# Patient Record
Sex: Female | Born: 1935 | Race: White | Hispanic: No | Smoking: Never smoker
Health system: Southern US, Community
[De-identification: ages and names within clinical notes are randomized; demographics above are authoritative.]

## PROBLEM LIST (undated history)

## (undated) DIAGNOSIS — I8390 Asymptomatic varicose veins of unspecified lower extremity: Secondary | ICD-10-CM

## (undated) DIAGNOSIS — G629 Polyneuropathy, unspecified: Secondary | ICD-10-CM

## (undated) DIAGNOSIS — R42 Dizziness and giddiness: Secondary | ICD-10-CM

## (undated) DIAGNOSIS — K219 Gastro-esophageal reflux disease without esophagitis: Secondary | ICD-10-CM

## (undated) DIAGNOSIS — K279 Peptic ulcer, site unspecified, unspecified as acute or chronic, without hemorrhage or perforation: Secondary | ICD-10-CM

## (undated) DIAGNOSIS — E611 Iron deficiency: Secondary | ICD-10-CM

## (undated) HISTORY — PX: TONSILLECTOMY: SUR1361

## (undated) HISTORY — DX: Iron deficiency: E61.1

## (undated) HISTORY — DX: Dizziness and giddiness: R42

## (undated) HISTORY — DX: Gastro-esophageal reflux disease without esophagitis: K21.9

## (undated) HISTORY — DX: Peptic ulcer, site unspecified, unspecified as acute or chronic, without hemorrhage or perforation: K27.9

## (undated) HISTORY — DX: Asymptomatic varicose veins of unspecified lower extremity: I83.90

## (undated) HISTORY — PX: ABDOMINAL HYSTERECTOMY: SHX81

## (undated) HISTORY — DX: Polyneuropathy, unspecified: G62.9

## (undated) HISTORY — PX: CATARACT EXTRACTION: SUR2

## (undated) HISTORY — PX: APPENDECTOMY: SHX54

## (undated) HISTORY — PX: BLADDER REPAIR: SHX76

## (undated) HISTORY — PX: FRACTURE SURGERY: SHX138

---

## 1993-06-13 ENCOUNTER — Encounter: Payer: Self-pay | Admitting: Family Medicine

## 2000-06-13 HISTORY — PX: OTHER SURGICAL HISTORY: SHX169

## 2000-07-04 ENCOUNTER — Encounter: Payer: Self-pay | Admitting: Family Medicine

## 2000-07-04 ENCOUNTER — Encounter: Admission: RE | Admit: 2000-07-04 | Discharge: 2000-07-04 | Payer: Self-pay | Admitting: Family Medicine

## 2000-07-13 HISTORY — PX: SPINE SURGERY: SHX786

## 2001-08-13 HISTORY — PX: OTHER SURGICAL HISTORY: SHX169

## 2003-01-14 HISTORY — PX: OTHER SURGICAL HISTORY: SHX169

## 2004-01-17 ENCOUNTER — Ambulatory Visit: Payer: Self-pay | Admitting: Family Medicine

## 2004-01-30 ENCOUNTER — Ambulatory Visit: Payer: Self-pay | Admitting: Family Medicine

## 2004-02-13 HISTORY — PX: COLONOSCOPY: SHX174

## 2004-02-24 ENCOUNTER — Ambulatory Visit: Payer: Self-pay | Admitting: Unknown Physician Specialty

## 2004-10-20 ENCOUNTER — Ambulatory Visit: Payer: Self-pay | Admitting: Family Medicine

## 2004-11-04 ENCOUNTER — Encounter: Payer: Self-pay | Admitting: Family Medicine

## 2004-11-13 ENCOUNTER — Encounter: Payer: Self-pay | Admitting: Family Medicine

## 2005-01-08 ENCOUNTER — Ambulatory Visit: Payer: Self-pay | Admitting: Family Medicine

## 2005-01-13 HISTORY — PX: OTHER SURGICAL HISTORY: SHX169

## 2005-01-13 LAB — HM DEXA SCAN

## 2005-03-15 HISTORY — PX: FOOT SURGERY: SHX648

## 2005-08-17 ENCOUNTER — Ambulatory Visit: Payer: Self-pay | Admitting: Podiatry

## 2005-08-17 ENCOUNTER — Other Ambulatory Visit: Payer: Self-pay

## 2005-08-20 ENCOUNTER — Ambulatory Visit: Payer: Self-pay | Admitting: Podiatry

## 2005-09-21 ENCOUNTER — Ambulatory Visit: Payer: Self-pay | Admitting: Family Medicine

## 2005-10-07 ENCOUNTER — Ambulatory Visit: Payer: Self-pay | Admitting: Family Medicine

## 2005-10-08 ENCOUNTER — Ambulatory Visit: Payer: Self-pay | Admitting: Oncology

## 2005-10-13 HISTORY — PX: FETAL BLOOD TRANSFUSION: SHX1602

## 2005-10-25 LAB — CBC WITH DIFFERENTIAL (CANCER CENTER ONLY)
BASO#: 0 10*3/uL (ref 0.0–0.2)
HCT: 32.4 % — ABNORMAL LOW (ref 34.8–46.6)
HGB: 10.5 g/dL — ABNORMAL LOW (ref 11.6–15.9)
MCH: 26.3 pg (ref 26.0–34.0)
MCHC: 32.5 g/dL (ref 32.0–36.0)
RDW: 16.8 % — ABNORMAL HIGH (ref 10.5–14.6)

## 2005-10-25 LAB — MORPHOLOGY - CHCC SATELLITE: PLT EST ~~LOC~~: ADEQUATE

## 2005-10-26 LAB — COMPREHENSIVE METABOLIC PANEL
AST: 24 U/L (ref 0–37)
Albumin: 3.4 g/dL — ABNORMAL LOW (ref 3.5–5.2)
Alkaline Phosphatase: 61 U/L (ref 39–117)
BUN: 25 mg/dL — ABNORMAL HIGH (ref 6–23)
Creatinine, Ser: 1.1 mg/dL (ref 0.40–1.20)
Glucose, Bld: 127 mg/dL — ABNORMAL HIGH (ref 70–99)
Potassium: 4.8 mEq/L (ref 3.5–5.3)
Total Bilirubin: 0.5 mg/dL (ref 0.3–1.2)

## 2005-10-26 LAB — VITAMIN B12: Vitamin B-12: 671 pg/mL (ref 211–911)

## 2005-10-26 LAB — IRON AND TIBC
%SAT: 49 % (ref 20–55)
Iron: 212 ug/dL — ABNORMAL HIGH (ref 42–145)
TIBC: 432 ug/dL (ref 250–470)
UIBC: 220 ug/dL

## 2005-10-26 LAB — FOLATE: Folate: >20 ng/mL

## 2005-10-26 LAB — RETICULOCYTES (CHCC)
RBC.: 4 MIL/uL (ref 3.87–5.11)
Retic Ct Pct: 1.6 % (ref 0.4–3.1)

## 2005-10-26 LAB — PROTEIN ELECTROPHORESIS, SERUM
Alpha-1-Globulin: 4.9 % (ref 2.9–4.9)
Alpha-2-Globulin: 14.2 % — ABNORMAL HIGH (ref 7.1–11.8)
Beta Globulin: 7.3 % — ABNORMAL HIGH (ref 4.7–7.2)
Gamma Globulin: 12.9 % (ref 11.1–18.8)
Total Protein, Serum Electrophoresis: 6.6 g/dL (ref 6.0–8.3)

## 2005-10-26 LAB — ERYTHROPOIETIN: Erythropoietin: 25.5 m[IU]/mL (ref 2.6–34.0)

## 2005-10-27 ENCOUNTER — Encounter (HOSPITAL_COMMUNITY): Admission: RE | Admit: 2005-10-27 | Discharge: 2005-12-08 | Payer: Self-pay | Admitting: Oncology

## 2005-10-28 LAB — FERRITIN: Ferritin: 62 ng/mL (ref 10–291)

## 2005-11-01 LAB — TYPE & CROSSMATCH - CHCC SATELLITE

## 2005-11-11 ENCOUNTER — Ambulatory Visit: Payer: Self-pay | Admitting: Unknown Physician Specialty

## 2005-11-13 HISTORY — PX: ESOPHAGOGASTRODUODENOSCOPY: SHX1529

## 2005-11-16 LAB — CBC WITH DIFFERENTIAL (CANCER CENTER ONLY)
Eosinophils Absolute: 0.2 10*3/uL (ref 0.0–0.5)
HCT: 39.9 % (ref 34.8–46.6)
LYMPH#: 2.1 10*3/uL (ref 0.9–3.3)
LYMPH%: 27 % (ref 14.0–48.0)
MCV: 85 fL (ref 81–101)
MONO#: 0.6 10*3/uL (ref 0.1–0.9)
NEUT%: 61.6 % (ref 39.6–80.0)
RBC: 4.71 10*6/uL (ref 3.70–5.32)
WBC: 7.6 10*3/uL (ref 3.9–10.0)

## 2005-11-29 ENCOUNTER — Ambulatory Visit: Payer: Self-pay | Admitting: Oncology

## 2005-12-01 ENCOUNTER — Other Ambulatory Visit: Admission: RE | Admit: 2005-12-01 | Discharge: 2005-12-01 | Payer: Self-pay | Admitting: Oncology

## 2005-12-01 ENCOUNTER — Encounter (INDEPENDENT_AMBULATORY_CARE_PROVIDER_SITE_OTHER): Payer: Self-pay | Admitting: Specialist

## 2005-12-01 ENCOUNTER — Encounter: Payer: Self-pay | Admitting: Oncology

## 2005-12-01 LAB — CBC WITH DIFFERENTIAL (CANCER CENTER ONLY)
BASO#: 0 10*3/uL (ref 0.0–0.2)
BASO%: 0.3 % (ref 0.0–2.0)
HCT: 35.8 % (ref 34.8–46.6)
HGB: 11.8 g/dL (ref 11.6–15.9)
LYMPH%: 23 % (ref 14.0–48.0)
MCHC: 33 g/dL (ref 32.0–36.0)
MCV: 85 fL (ref 81–101)
MONO#: 0.5 10*3/uL (ref 0.1–0.9)
NEUT%: 66.1 % (ref 39.6–80.0)
RDW: 19.5 % — ABNORMAL HIGH (ref 10.5–14.6)
WBC: 6.6 10*3/uL (ref 3.9–10.0)

## 2005-12-28 ENCOUNTER — Ambulatory Visit: Payer: Self-pay | Admitting: Family Medicine

## 2006-01-19 ENCOUNTER — Ambulatory Visit: Payer: Self-pay | Admitting: Oncology

## 2006-01-21 LAB — CBC WITH DIFFERENTIAL (CANCER CENTER ONLY)
BASO#: 0 10*3/uL (ref 0.0–0.2)
EOS%: 2.2 % (ref 0.0–7.0)
HCT: 41 % (ref 34.8–46.6)
HGB: 13.5 g/dL (ref 11.6–15.9)
LYMPH#: 1.5 10*3/uL (ref 0.9–3.3)
MCHC: 32.8 g/dL (ref 32.0–36.0)
MCV: 91 fL (ref 81–101)
NEUT%: 65.4 % (ref 39.6–80.0)

## 2006-02-10 LAB — CBC WITH DIFFERENTIAL (CANCER CENTER ONLY)
BASO#: 0 10*3/uL (ref 0.0–0.2)
EOS%: 1.8 % (ref 0.0–7.0)
HCT: 38.4 % (ref 34.8–46.6)
HGB: 12.6 g/dL (ref 11.6–15.9)
MCH: 29.9 pg (ref 26.0–34.0)
MCHC: 32.9 g/dL (ref 32.0–36.0)
MONO%: 9.6 % (ref 0.0–13.0)
NEUT#: 3.7 10*3/uL (ref 1.5–6.5)
NEUT%: 62.3 % (ref 39.6–80.0)

## 2006-02-12 HISTORY — PX: ESOPHAGOGASTRODUODENOSCOPY: SHX1529

## 2006-02-26 ENCOUNTER — Ambulatory Visit: Payer: Self-pay | Admitting: Unknown Physician Specialty

## 2006-03-03 LAB — CBC WITH DIFFERENTIAL (CANCER CENTER ONLY)
BASO#: 0 10*3/uL (ref 0.0–0.2)
BASO%: 0.6 % (ref 0.0–2.0)
Eosinophils Absolute: 0.2 10*3/uL (ref 0.0–0.5)
HCT: 38.7 % (ref 34.8–46.6)
HGB: 12.9 g/dL (ref 11.6–15.9)
LYMPH#: 1.7 10*3/uL (ref 0.9–3.3)
MONO#: 0.6 10*3/uL (ref 0.1–0.9)
NEUT%: 61.7 % (ref 39.6–80.0)
RBC: 4.18 10*6/uL (ref 3.70–5.32)
WBC: 6.6 10*3/uL (ref 3.9–10.0)

## 2006-03-22 ENCOUNTER — Ambulatory Visit: Payer: Self-pay | Admitting: Oncology

## 2006-03-24 LAB — CBC WITH DIFFERENTIAL (CANCER CENTER ONLY)
BASO#: 0 10*3/uL (ref 0.0–0.2)
EOS%: 2.1 % (ref 0.0–7.0)
Eosinophils Absolute: 0.1 10*3/uL (ref 0.0–0.5)
HGB: 13.4 g/dL (ref 11.6–15.9)
LYMPH#: 1.7 10*3/uL (ref 0.9–3.3)
MCH: 30.8 pg (ref 26.0–34.0)
MONO%: 9 % (ref 0.0–13.0)
NEUT#: 3.5 10*3/uL (ref 1.5–6.5)
Platelets: 217 10*3/uL (ref 145–400)
RBC: 4.35 10*6/uL (ref 3.70–5.32)

## 2006-04-14 LAB — CBC WITH DIFFERENTIAL (CANCER CENTER ONLY)
BASO#: 0 10*3/uL (ref 0.0–0.2)
BASO%: 0.4 % (ref 0.0–2.0)
EOS%: 2.3 % (ref 0.0–7.0)
HGB: 12.4 g/dL (ref 11.6–15.9)
LYMPH#: 1.9 10*3/uL (ref 0.9–3.3)
MCH: 31.1 pg (ref 26.0–34.0)
MCHC: 33.7 g/dL (ref 32.0–36.0)
MONO%: 9.9 % (ref 0.0–13.0)
NEUT#: 3.6 10*3/uL (ref 1.5–6.5)
Platelets: 231 10*3/uL (ref 145–400)
RDW: 13.7 % (ref 10.5–14.6)

## 2006-05-05 LAB — CBC WITH DIFFERENTIAL (CANCER CENTER ONLY)
BASO#: 0 10*3/uL (ref 0.0–0.2)
BASO%: 0.3 % (ref 0.0–2.0)
EOS%: 1.8 % (ref 0.0–7.0)
HCT: 36.5 % (ref 34.8–46.6)
HGB: 12.2 g/dL (ref 11.6–15.9)
LYMPH#: 1.5 10*3/uL (ref 0.9–3.3)
LYMPH%: 27.8 % (ref 14.0–48.0)
MCH: 31.1 pg (ref 26.0–34.0)
MCHC: 33.3 g/dL (ref 32.0–36.0)
MONO%: 10 % (ref 0.0–13.0)
NEUT%: 60.1 % (ref 39.6–80.0)
RDW: 13.7 % (ref 10.5–14.6)

## 2006-05-24 ENCOUNTER — Ambulatory Visit: Payer: Self-pay | Admitting: Oncology

## 2006-05-26 LAB — CBC WITH DIFFERENTIAL (CANCER CENTER ONLY)
BASO#: 0 10*3/uL (ref 0.0–0.2)
Eosinophils Absolute: 0.1 10*3/uL (ref 0.0–0.5)
HGB: 11.5 g/dL — ABNORMAL LOW (ref 11.6–15.9)
MCV: 95 fL (ref 81–101)
MONO#: 0.5 10*3/uL (ref 0.1–0.9)
NEUT#: 3.6 10*3/uL (ref 1.5–6.5)
Platelets: 270 10*3/uL (ref 145–400)
RBC: 3.77 10*6/uL (ref 3.70–5.32)
WBC: 5.5 10*3/uL (ref 3.9–10.0)

## 2006-05-26 LAB — IRON AND TIBC
TIBC: 334 ug/dL (ref 250–470)
UIBC: 275 ug/dL

## 2006-06-23 LAB — CBC WITH DIFFERENTIAL (CANCER CENTER ONLY)
BASO#: 0 10*3/uL (ref 0.0–0.2)
Eosinophils Absolute: 0.1 10*3/uL (ref 0.0–0.5)
HCT: 35.1 % (ref 34.8–46.6)
LYMPH%: 26.6 % (ref 14.0–48.0)
MCH: 30.1 pg (ref 26.0–34.0)
MCV: 92 fL (ref 81–101)
MONO#: 0.6 10*3/uL (ref 0.1–0.9)
NEUT%: 62.1 % (ref 39.6–80.0)
RBC: 3.84 10*6/uL (ref 3.70–5.32)
WBC: 6.4 10*3/uL (ref 3.9–10.0)

## 2006-07-04 ENCOUNTER — Ambulatory Visit: Payer: Self-pay | Admitting: Family Medicine

## 2006-07-20 ENCOUNTER — Ambulatory Visit: Payer: Self-pay | Admitting: Oncology

## 2006-07-21 LAB — CBC WITH DIFFERENTIAL (CANCER CENTER ONLY)
BASO%: 0.5 % (ref 0.0–2.0)
EOS%: 2.8 % (ref 0.0–7.0)
HCT: 33.3 % — ABNORMAL LOW (ref 34.8–46.6)
LYMPH%: 28.9 % (ref 14.0–48.0)
MCH: 28.9 pg (ref 26.0–34.0)
MCHC: 32.9 g/dL (ref 32.0–36.0)
MCV: 88 fL (ref 81–101)
MONO#: 0.6 10*3/uL (ref 0.1–0.9)
MONO%: 9.5 % (ref 0.0–13.0)
NEUT%: 58.3 % (ref 39.6–80.0)
RDW: 12 % (ref 10.5–14.6)

## 2006-07-27 ENCOUNTER — Telehealth (INDEPENDENT_AMBULATORY_CARE_PROVIDER_SITE_OTHER): Payer: Self-pay | Admitting: *Deleted

## 2006-08-24 LAB — CBC WITH DIFFERENTIAL (CANCER CENTER ONLY)
Eosinophils Absolute: 0.1 10*3/uL (ref 0.0–0.5)
MONO#: 0.5 10*3/uL (ref 0.1–0.9)
MONO%: 8.3 % (ref 0.0–13.0)
NEUT#: 4 10*3/uL (ref 1.5–6.5)
Platelets: 293 10*3/uL (ref 145–400)
RBC: 3.69 10*6/uL — ABNORMAL LOW (ref 3.70–5.32)
WBC: 6.4 10*3/uL (ref 3.9–10.0)

## 2006-09-14 ENCOUNTER — Ambulatory Visit: Payer: Self-pay | Admitting: Oncology

## 2006-09-15 LAB — BASIC METABOLIC PANEL
BUN: 27 mg/dL — ABNORMAL HIGH (ref 6–23)
Calcium: 8.8 mg/dL (ref 8.4–10.5)
Glucose, Bld: 91 mg/dL (ref 70–99)

## 2006-09-15 LAB — CBC WITH DIFFERENTIAL (CANCER CENTER ONLY)
Eosinophils Absolute: 0.1 10*3/uL (ref 0.0–0.5)
HCT: 34.3 % — ABNORMAL LOW (ref 34.8–46.6)
LYMPH%: 31.1 % (ref 14.0–48.0)
MCV: 81 fL (ref 81–101)
MONO#: 0.5 10*3/uL (ref 0.1–0.9)
Platelets: 298 10*3/uL (ref 145–400)
RBC: 4.25 10*6/uL (ref 3.70–5.32)
WBC: 6.2 10*3/uL (ref 3.9–10.0)

## 2006-10-06 LAB — CBC WITH DIFFERENTIAL (CANCER CENTER ONLY)
BASO#: 0 10*3/uL (ref 0.0–0.2)
BASO%: 0.5 % (ref 0.0–2.0)
Eosinophils Absolute: 0.1 10*3/uL (ref 0.0–0.5)
HCT: 32.1 % — ABNORMAL LOW (ref 34.8–46.6)
HGB: 10.4 g/dL — ABNORMAL LOW (ref 11.6–15.9)
LYMPH%: 31 % (ref 14.0–48.0)
MCV: 80 fL — ABNORMAL LOW (ref 81–101)
MONO#: 0.6 10*3/uL (ref 0.1–0.9)
NEUT%: 56.6 % (ref 39.6–80.0)
RDW: 15.9 % — ABNORMAL HIGH (ref 10.5–14.6)
WBC: 5.9 10*3/uL (ref 3.9–10.0)

## 2006-10-27 LAB — CBC WITH DIFFERENTIAL (CANCER CENTER ONLY)
BASO%: 0.6 % (ref 0.0–2.0)
EOS%: 2.2 % (ref 0.0–7.0)
HCT: 34.9 % (ref 34.8–46.6)
LYMPH#: 1.2 10*3/uL (ref 0.9–3.3)
LYMPH%: 22 % (ref 14.0–48.0)
MCHC: 32.1 g/dL (ref 32.0–36.0)
NEUT%: 60.3 % (ref 39.6–80.0)
Platelets: 247 10*3/uL (ref 145–400)
RDW: 16.5 % — ABNORMAL HIGH (ref 10.5–14.6)

## 2006-11-16 ENCOUNTER — Ambulatory Visit: Payer: Self-pay | Admitting: Oncology

## 2006-11-17 LAB — CBC WITH DIFFERENTIAL (CANCER CENTER ONLY)
BASO%: 0.3 % (ref 0.0–2.0)
EOS%: 1.8 % (ref 0.0–7.0)
HGB: 11.5 g/dL — ABNORMAL LOW (ref 11.6–15.9)
LYMPH#: 1.4 10*3/uL (ref 0.9–3.3)
MCHC: 32.5 g/dL (ref 32.0–36.0)
NEUT#: 4.3 10*3/uL (ref 1.5–6.5)
RDW: 17 % — ABNORMAL HIGH (ref 10.5–14.6)

## 2006-12-08 LAB — CBC WITH DIFFERENTIAL (CANCER CENTER ONLY)
EOS%: 2.1 % (ref 0.0–7.0)
MCH: 26 pg (ref 26.0–34.0)
MCHC: 31.9 g/dL — ABNORMAL LOW (ref 32.0–36.0)
MONO%: 7.3 % (ref 0.0–13.0)
NEUT#: 3.2 10*3/uL (ref 1.5–6.5)
Platelets: 248 10*3/uL (ref 145–400)
RBC: 4.77 10*6/uL (ref 3.70–5.32)

## 2006-12-29 LAB — CBC WITH DIFFERENTIAL (CANCER CENTER ONLY)
BASO%: 0.3 % (ref 0.0–2.0)
EOS%: 2.4 % (ref 0.0–7.0)
LYMPH#: 1.5 10*3/uL (ref 0.9–3.3)
MCH: 26.3 pg (ref 26.0–34.0)
MCHC: 32.2 g/dL (ref 32.0–36.0)
MONO%: 7.1 % (ref 0.0–13.0)
NEUT#: 4.8 10*3/uL (ref 1.5–6.5)
NEUT%: 68.3 % (ref 39.6–80.0)
RDW: 16.5 % — ABNORMAL HIGH (ref 10.5–14.6)

## 2007-01-06 ENCOUNTER — Ambulatory Visit: Payer: Self-pay | Admitting: Family Medicine

## 2007-01-18 ENCOUNTER — Ambulatory Visit: Payer: Self-pay | Admitting: Oncology

## 2007-01-19 LAB — CBC WITH DIFFERENTIAL (CANCER CENTER ONLY)
BASO#: 0 10*3/uL (ref 0.0–0.2)
EOS%: 2.7 % (ref 0.0–7.0)
HGB: 10.2 g/dL — ABNORMAL LOW (ref 11.6–15.9)
LYMPH#: 1.6 10*3/uL (ref 0.9–3.3)
MCH: 25.8 pg — ABNORMAL LOW (ref 26.0–34.0)
MCHC: 32.2 g/dL (ref 32.0–36.0)
MONO%: 8.5 % (ref 0.0–13.0)
NEUT#: 3.2 10*3/uL (ref 1.5–6.5)
Platelets: 268 10*3/uL (ref 145–400)
RBC: 3.95 10*6/uL (ref 3.70–5.32)

## 2007-02-10 LAB — CBC WITH DIFFERENTIAL (CANCER CENTER ONLY)
BASO#: 0 10*3/uL (ref 0.0–0.2)
Eosinophils Absolute: 0.2 10*3/uL (ref 0.0–0.5)
HGB: 10.3 g/dL — ABNORMAL LOW (ref 11.6–15.9)
LYMPH#: 1.8 10*3/uL (ref 0.9–3.3)
MCH: 25.7 pg — ABNORMAL LOW (ref 26.0–34.0)
MONO#: 0.6 10*3/uL (ref 0.1–0.9)
MONO%: 10.4 % (ref 0.0–13.0)
NEUT#: 3.5 10*3/uL (ref 1.5–6.5)
Platelets: 268 10*3/uL (ref 145–400)
RBC: 4.03 10*6/uL (ref 3.70–5.32)
WBC: 6.1 10*3/uL (ref 3.9–10.0)

## 2007-02-20 ENCOUNTER — Encounter: Payer: Self-pay | Admitting: Family Medicine

## 2007-03-03 LAB — CBC WITH DIFFERENTIAL (CANCER CENTER ONLY)
BASO#: 0.1 10*3/uL (ref 0.0–0.2)
Eosinophils Absolute: 0.2 10*3/uL (ref 0.0–0.5)
HCT: 33.4 % — ABNORMAL LOW (ref 34.8–46.6)
HGB: 10.7 g/dL — ABNORMAL LOW (ref 11.6–15.9)
LYMPH%: 23.8 % (ref 14.0–48.0)
MCH: 25.6 pg — ABNORMAL LOW (ref 26.0–34.0)
MCV: 80 fL — ABNORMAL LOW (ref 81–101)
MONO#: 0.9 10*3/uL (ref 0.1–0.9)
MONO%: 12.1 % (ref 0.0–13.0)
NEUT%: 61.4 % (ref 39.6–80.0)
Platelets: 256 10*3/uL (ref 145–400)
RBC: 4.16 10*6/uL (ref 3.70–5.32)
WBC: 7.3 10*3/uL (ref 3.9–10.0)

## 2007-03-03 LAB — BASIC METABOLIC PANEL
Calcium: 9.2 mg/dL (ref 8.4–10.5)
Sodium: 138 mEq/L (ref 135–145)

## 2007-03-03 LAB — RETICULOCYTES (CHCC)
ABS Retic: 46.9 10*3/uL (ref 19.0–186.0)
RBC.: 4.26 MIL/uL (ref 3.87–5.11)
Retic Ct Pct: 1.1 % (ref 0.4–3.1)

## 2007-03-03 LAB — FERRITIN: Ferritin: 19 ng/mL (ref 10–291)

## 2007-03-03 LAB — IRON AND TIBC: Iron: 46 ug/dL (ref 42–145)

## 2007-03-21 ENCOUNTER — Ambulatory Visit: Payer: Self-pay | Admitting: Oncology

## 2007-03-23 LAB — CBC WITH DIFFERENTIAL (CANCER CENTER ONLY)
BASO#: 0 10*3/uL (ref 0.0–0.2)
BASO%: 0.4 % (ref 0.0–2.0)
EOS%: 1.8 % (ref 0.0–7.0)
HCT: 33.1 % — ABNORMAL LOW (ref 34.8–46.6)
LYMPH%: 21.8 % (ref 14.0–48.0)
MCH: 27.5 pg (ref 26.0–34.0)
MCHC: 32.5 g/dL (ref 32.0–36.0)
MCV: 84 fL (ref 81–101)
MONO%: 8.8 % (ref 0.0–13.0)
NEUT%: 67.2 % (ref 39.6–80.0)
RDW: 16.5 % — ABNORMAL HIGH (ref 10.5–14.6)

## 2007-04-06 ENCOUNTER — Encounter (INDEPENDENT_AMBULATORY_CARE_PROVIDER_SITE_OTHER): Payer: Self-pay | Admitting: Internal Medicine

## 2007-04-06 ENCOUNTER — Encounter (INDEPENDENT_AMBULATORY_CARE_PROVIDER_SITE_OTHER): Payer: Self-pay | Admitting: *Deleted

## 2007-04-06 ENCOUNTER — Ambulatory Visit: Payer: Self-pay | Admitting: Family Medicine

## 2007-04-07 ENCOUNTER — Encounter (INDEPENDENT_AMBULATORY_CARE_PROVIDER_SITE_OTHER): Payer: Self-pay | Admitting: Internal Medicine

## 2007-04-07 ENCOUNTER — Telehealth (INDEPENDENT_AMBULATORY_CARE_PROVIDER_SITE_OTHER): Payer: Self-pay | Admitting: Internal Medicine

## 2007-04-07 DIAGNOSIS — S92909A Unspecified fracture of unspecified foot, initial encounter for closed fracture: Secondary | ICD-10-CM | POA: Insufficient documentation

## 2007-04-10 ENCOUNTER — Encounter (INDEPENDENT_AMBULATORY_CARE_PROVIDER_SITE_OTHER): Payer: Self-pay | Admitting: Internal Medicine

## 2007-05-04 LAB — CBC WITH DIFFERENTIAL (CANCER CENTER ONLY)
BASO%: 0.5 % (ref 0.0–2.0)
Eosinophils Absolute: 0.2 10*3/uL (ref 0.0–0.5)
LYMPH#: 2 10*3/uL (ref 0.9–3.3)
MONO#: 0.7 10*3/uL (ref 0.1–0.9)
Platelets: 214 10*3/uL (ref 145–400)
RBC: 3.99 10*6/uL (ref 3.70–5.32)
RDW: 17.9 % — ABNORMAL HIGH (ref 10.5–14.6)
WBC: 6.2 10*3/uL (ref 3.9–10.0)

## 2007-05-08 ENCOUNTER — Ambulatory Visit: Payer: Self-pay | Admitting: Family Medicine

## 2007-05-08 DIAGNOSIS — M810 Age-related osteoporosis without current pathological fracture: Secondary | ICD-10-CM | POA: Insufficient documentation

## 2007-05-08 DIAGNOSIS — Z85828 Personal history of other malignant neoplasm of skin: Secondary | ICD-10-CM

## 2007-05-08 DIAGNOSIS — I447 Left bundle-branch block, unspecified: Secondary | ICD-10-CM

## 2007-05-08 DIAGNOSIS — K279 Peptic ulcer, site unspecified, unspecified as acute or chronic, without hemorrhage or perforation: Secondary | ICD-10-CM | POA: Insufficient documentation

## 2007-05-08 DIAGNOSIS — A048 Other specified bacterial intestinal infections: Secondary | ICD-10-CM | POA: Insufficient documentation

## 2007-05-08 DIAGNOSIS — M5137 Other intervertebral disc degeneration, lumbosacral region: Secondary | ICD-10-CM

## 2007-05-08 DIAGNOSIS — M549 Dorsalgia, unspecified: Secondary | ICD-10-CM | POA: Insufficient documentation

## 2007-05-08 DIAGNOSIS — K219 Gastro-esophageal reflux disease without esophagitis: Secondary | ICD-10-CM | POA: Insufficient documentation

## 2007-05-08 DIAGNOSIS — D638 Anemia in other chronic diseases classified elsewhere: Secondary | ICD-10-CM | POA: Insufficient documentation

## 2007-05-09 DIAGNOSIS — R5381 Other malaise: Secondary | ICD-10-CM

## 2007-05-09 DIAGNOSIS — R5383 Other fatigue: Secondary | ICD-10-CM

## 2007-05-09 LAB — CONVERTED CEMR LAB
Alkaline Phosphatase: 53 units/L (ref 39–117)
BUN: 30 mg/dL — ABNORMAL HIGH (ref 6–23)
Basophils Absolute: 0 10*3/uL (ref 0.0–0.1)
Bilirubin, Direct: 0.1 mg/dL (ref 0.0–0.3)
Calcium: 9.2 mg/dL (ref 8.4–10.5)
Eosinophils Absolute: 0.2 10*3/uL (ref 0.0–0.6)
GFR calc Af Amer: 55 mL/min
Glucose, Bld: 124 mg/dL — ABNORMAL HIGH (ref 70–99)
HCT: 37.2 % (ref 36.0–46.0)
Hemoglobin: 11.7 g/dL — ABNORMAL LOW (ref 12.0–15.0)
Lymphocytes Relative: 18 % (ref 12.0–46.0)
MCHC: 31.4 g/dL (ref 30.0–36.0)
MCV: 89.4 fL (ref 78.0–100.0)
Monocytes Absolute: 1 10*3/uL — ABNORMAL HIGH (ref 0.2–0.7)
Neutro Abs: 6 10*3/uL (ref 1.4–7.7)
Neutrophils Relative %: 69.2 % (ref 43.0–77.0)
Potassium: 4.6 meq/L (ref 3.5–5.1)
TSH: 3.94 microintl units/mL (ref 0.35–5.50)
Total Bilirubin: 0.5 mg/dL (ref 0.3–1.2)
Total Protein: 6.8 g/dL (ref 6.0–8.3)

## 2007-05-17 ENCOUNTER — Ambulatory Visit: Payer: Self-pay | Admitting: Family Medicine

## 2007-05-17 DIAGNOSIS — F329 Major depressive disorder, single episode, unspecified: Secondary | ICD-10-CM

## 2007-05-18 ENCOUNTER — Ambulatory Visit: Payer: Self-pay | Admitting: Internal Medicine

## 2007-05-22 ENCOUNTER — Ambulatory Visit: Payer: Self-pay | Admitting: Oncology

## 2007-05-23 ENCOUNTER — Ambulatory Visit: Payer: Self-pay

## 2007-05-23 ENCOUNTER — Encounter: Payer: Self-pay | Admitting: Family Medicine

## 2007-05-25 LAB — CBC WITH DIFFERENTIAL (CANCER CENTER ONLY)
EOS%: 5.8 % (ref 0.0–7.0)
Eosinophils Absolute: 0.3 10*3/uL (ref 0.0–0.5)
LYMPH%: 22 % (ref 14.0–48.0)
MCH: 27.9 pg (ref 26.0–34.0)
MCHC: 32.1 g/dL (ref 32.0–36.0)
MCV: 87 fL (ref 81–101)
MONO%: 12.9 % (ref 0.0–13.0)
NEUT#: 3.3 10*3/uL (ref 1.5–6.5)
Platelets: 254 10*3/uL (ref 145–400)
RBC: 4.05 10*6/uL (ref 3.70–5.32)

## 2007-06-01 ENCOUNTER — Encounter: Payer: Self-pay | Admitting: Family Medicine

## 2007-06-01 ENCOUNTER — Ambulatory Visit: Payer: Self-pay | Admitting: Family Medicine

## 2007-06-05 ENCOUNTER — Encounter (INDEPENDENT_AMBULATORY_CARE_PROVIDER_SITE_OTHER): Payer: Self-pay | Admitting: *Deleted

## 2007-06-15 LAB — CBC WITH DIFFERENTIAL (CANCER CENTER ONLY)
BASO%: 0.5 % (ref 0.0–2.0)
EOS%: 2.6 % (ref 0.0–7.0)
LYMPH%: 24.2 % (ref 14.0–48.0)
MCH: 27.8 pg (ref 26.0–34.0)
MCHC: 32.2 g/dL (ref 32.0–36.0)
MCV: 86 fL (ref 81–101)
MONO%: 10.9 % (ref 0.0–13.0)
Platelets: 232 10*3/uL (ref 145–400)
RDW: 13.8 % (ref 10.5–14.6)
WBC: 4.5 10*3/uL (ref 3.9–10.0)

## 2007-06-27 ENCOUNTER — Ambulatory Visit: Payer: Self-pay | Admitting: Family Medicine

## 2007-07-05 ENCOUNTER — Ambulatory Visit: Payer: Self-pay | Admitting: Oncology

## 2007-07-06 LAB — CBC WITH DIFFERENTIAL (CANCER CENTER ONLY)
BASO%: 0.4 % (ref 0.0–2.0)
LYMPH%: 26.6 % (ref 14.0–48.0)
MCH: 27.3 pg (ref 26.0–34.0)
MCV: 84 fL (ref 81–101)
MONO#: 0.6 10*3/uL (ref 0.1–0.9)
MONO%: 9.6 % (ref 0.0–13.0)
NEUT#: 3.7 10*3/uL (ref 1.5–6.5)
Platelets: 247 10*3/uL (ref 145–400)
RBC: 4.18 10*6/uL (ref 3.70–5.32)
RDW: 14 % (ref 10.5–14.6)
WBC: 6.1 10*3/uL (ref 3.9–10.0)

## 2007-08-17 ENCOUNTER — Encounter: Payer: Self-pay | Admitting: Family Medicine

## 2007-08-17 LAB — BASIC METABOLIC PANEL
BUN: 21 mg/dL (ref 6–23)
Chloride: 102 mEq/L (ref 96–112)
Creatinine, Ser: 0.98 mg/dL (ref 0.40–1.20)

## 2007-08-17 LAB — CBC WITH DIFFERENTIAL (CANCER CENTER ONLY)
BASO#: 0 10*3/uL (ref 0.0–0.2)
BASO%: 0.5 % (ref 0.0–2.0)
EOS%: 2.2 % (ref 0.0–7.0)
HCT: 37.4 % (ref 34.8–46.6)
HGB: 12.5 g/dL (ref 11.6–15.9)
LYMPH#: 1.2 10*3/uL (ref 0.9–3.3)
MONO#: 0.6 10*3/uL (ref 0.1–0.9)
NEUT#: 3.1 10*3/uL (ref 1.5–6.5)
NEUT%: 62 % (ref 39.6–80.0)
RDW: 15.3 % — ABNORMAL HIGH (ref 10.5–14.6)
WBC: 5 10*3/uL (ref 3.9–10.0)

## 2007-08-18 LAB — IRON AND TIBC
Iron: 341 ug/dL — ABNORMAL HIGH (ref 42–145)
TIBC: 397 ug/dL (ref 250–470)
UIBC: 56 ug/dL

## 2007-09-06 ENCOUNTER — Ambulatory Visit: Payer: Self-pay | Admitting: Oncology

## 2007-09-07 LAB — CBC WITH DIFFERENTIAL (CANCER CENTER ONLY)
BASO#: 0 10*3/uL (ref 0.0–0.2)
BASO%: 0.2 % (ref 0.0–2.0)
EOS%: 3 % (ref 0.0–7.0)
Eosinophils Absolute: 0.2 10*3/uL (ref 0.0–0.5)
HCT: 34.2 % — ABNORMAL LOW (ref 34.8–46.6)
HGB: 11.4 g/dL — ABNORMAL LOW (ref 11.6–15.9)
LYMPH#: 1.4 10*3/uL (ref 0.9–3.3)
LYMPH%: 26.8 % (ref 14.0–48.0)
MCH: 28.5 pg (ref 26.0–34.0)
MCHC: 33.4 g/dL (ref 32.0–36.0)
MCV: 85 fL (ref 81–101)
MONO#: 0.5 10*3/uL (ref 0.1–0.9)
MONO%: 8.9 % (ref 0.0–13.0)
NEUT#: 3.2 10*3/uL (ref 1.5–6.5)
NEUT%: 61.1 % (ref 39.6–80.0)
Platelets: 226 10*3/uL (ref 145–400)
RBC: 4 10*6/uL (ref 3.70–5.32)
RDW: 15.2 % — ABNORMAL HIGH (ref 10.5–14.6)
WBC: 5.2 10*3/uL (ref 3.9–10.0)

## 2007-09-28 LAB — CBC WITH DIFFERENTIAL (CANCER CENTER ONLY)
BASO#: 0 10*3/uL (ref 0.0–0.2)
EOS%: 2.5 % (ref 0.0–7.0)
Eosinophils Absolute: 0.2 10*3/uL (ref 0.0–0.5)
HCT: 31 % — ABNORMAL LOW (ref 34.8–46.6)
HGB: 10.2 g/dL — ABNORMAL LOW (ref 11.6–15.9)
LYMPH#: 1.4 10*3/uL (ref 0.9–3.3)
MCHC: 32.8 g/dL (ref 32.0–36.0)
NEUT#: 3.9 10*3/uL (ref 1.5–6.5)
NEUT%: 64.6 % (ref 39.6–80.0)
RBC: 3.79 10*6/uL (ref 3.70–5.32)

## 2007-10-19 LAB — CBC WITH DIFFERENTIAL (CANCER CENTER ONLY)
BASO#: 0 10*3/uL (ref 0.0–0.2)
Eosinophils Absolute: 0.1 10*3/uL (ref 0.0–0.5)
HCT: 30.3 % — ABNORMAL LOW (ref 34.8–46.6)
HGB: 10 g/dL — ABNORMAL LOW (ref 11.6–15.9)
LYMPH%: 25.3 % (ref 14.0–48.0)
MCH: 26.3 pg (ref 26.0–34.0)
MCV: 80 fL — ABNORMAL LOW (ref 81–101)
MONO%: 10.3 % (ref 0.0–13.0)
RBC: 3.8 10*6/uL (ref 3.70–5.32)

## 2007-11-08 ENCOUNTER — Ambulatory Visit: Payer: Self-pay | Admitting: Oncology

## 2007-11-09 ENCOUNTER — Encounter: Payer: Self-pay | Admitting: Family Medicine

## 2007-11-09 LAB — CBC WITH DIFFERENTIAL (CANCER CENTER ONLY)
BASO#: 0 10*3/uL (ref 0.0–0.2)
EOS%: 2.1 % (ref 0.0–7.0)
Eosinophils Absolute: 0.1 10*3/uL (ref 0.0–0.5)
HCT: 27.8 % — ABNORMAL LOW (ref 34.8–46.6)
HGB: 8.9 g/dL — ABNORMAL LOW (ref 11.6–15.9)
LYMPH%: 20.2 % (ref 14.0–48.0)
MCH: 25 pg — ABNORMAL LOW (ref 26.0–34.0)
MCHC: 32 g/dL (ref 32.0–36.0)
MCV: 78 fL — ABNORMAL LOW (ref 81–101)
MONO%: 8.7 % (ref 0.0–13.0)
NEUT#: 4.4 10*3/uL (ref 1.5–6.5)
NEUT%: 68.8 % (ref 39.6–80.0)
RBC: 3.56 10*6/uL — ABNORMAL LOW (ref 3.70–5.32)

## 2007-11-10 ENCOUNTER — Encounter (HOSPITAL_COMMUNITY): Admission: RE | Admit: 2007-11-10 | Discharge: 2007-11-23 | Payer: Self-pay | Admitting: Oncology

## 2007-11-10 LAB — IRON AND TIBC
Iron: 404 ug/dL — ABNORMAL HIGH (ref 42–145)
UIBC: <55 ug/dL

## 2007-11-10 LAB — RETICULOCYTES (CHCC): Retic Ct Pct: 0.9 % (ref 0.4–3.1)

## 2007-11-10 LAB — FERRITIN: Ferritin: 29 ng/mL (ref 10–291)

## 2007-11-13 LAB — TYPE & CROSSMATCH - CHCC SATELLITE

## 2007-11-22 ENCOUNTER — Encounter: Payer: Self-pay | Admitting: Family Medicine

## 2007-11-22 LAB — CBC WITH DIFFERENTIAL (CANCER CENTER ONLY)
BASO#: 0 10*3/uL (ref 0.0–0.2)
Eosinophils Absolute: 0.1 10*3/uL (ref 0.0–0.5)
HCT: 39.3 % (ref 34.8–46.6)
HGB: 12.7 g/dL (ref 11.6–15.9)
LYMPH#: 1.2 10*3/uL (ref 0.9–3.3)
MCH: 26 pg (ref 26.0–34.0)
MONO%: 11.3 % (ref 0.0–13.0)
NEUT#: 3.4 10*3/uL (ref 1.5–6.5)
RBC: 4.88 10*6/uL (ref 3.70–5.32)

## 2007-11-27 LAB — FECAL OCCULT BLOOD, GUIAC - CHCC SATELLITE: Occult Blood: POSITIVE

## 2007-11-30 LAB — CBC WITH DIFFERENTIAL (CANCER CENTER ONLY)
BASO#: 0 10*3/uL (ref 0.0–0.2)
EOS%: 1.9 % (ref 0.0–7.0)
HCT: 36 % (ref 34.8–46.6)
LYMPH#: 1.3 10*3/uL (ref 0.9–3.3)
MCH: 25.9 pg — ABNORMAL LOW (ref 26.0–34.0)
MCV: 79 fL — ABNORMAL LOW (ref 81–101)
MONO#: 0.5 10*3/uL (ref 0.1–0.9)
NEUT#: 4 10*3/uL (ref 1.5–6.5)

## 2007-12-11 ENCOUNTER — Ambulatory Visit: Payer: Self-pay | Admitting: Family Medicine

## 2007-12-19 ENCOUNTER — Encounter: Payer: Self-pay | Admitting: Family Medicine

## 2007-12-19 LAB — CBC WITH DIFFERENTIAL (CANCER CENTER ONLY)
BASO#: 0 10*3/uL (ref 0.0–0.2)
Eosinophils Absolute: 0.2 10*3/uL (ref 0.0–0.5)
HGB: 10.6 g/dL — ABNORMAL LOW (ref 11.6–15.9)
LYMPH#: 1.3 10*3/uL (ref 0.9–3.3)
MCH: 25.5 pg — ABNORMAL LOW (ref 26.0–34.0)
MONO#: 0.7 10*3/uL (ref 0.1–0.9)
NEUT#: 4 10*3/uL (ref 1.5–6.5)
RBC: 4.17 10*6/uL (ref 3.70–5.32)
WBC: 6.2 10*3/uL (ref 3.9–10.0)

## 2008-01-02 ENCOUNTER — Encounter: Payer: Self-pay | Admitting: Family Medicine

## 2008-01-10 ENCOUNTER — Ambulatory Visit: Payer: Self-pay | Admitting: Oncology

## 2008-01-11 LAB — CBC WITH DIFFERENTIAL (CANCER CENTER ONLY)
BASO#: 0 10*3/uL (ref 0.0–0.2)
Eosinophils Absolute: 0.1 10*3/uL (ref 0.0–0.5)
HGB: 10.1 g/dL — ABNORMAL LOW (ref 11.6–15.9)
LYMPH%: 20.1 % (ref 14.0–48.0)
MCH: 25.2 pg — ABNORMAL LOW (ref 26.0–34.0)
MCV: 78 fL — ABNORMAL LOW (ref 81–101)
MONO%: 8.6 % (ref 0.0–13.0)
NEUT%: 68.8 % (ref 39.6–80.0)
RBC: 4.03 10*6/uL (ref 3.70–5.32)

## 2008-01-14 HISTORY — PX: ESOPHAGOGASTRODUODENOSCOPY: SHX1529

## 2008-01-23 ENCOUNTER — Ambulatory Visit: Payer: Self-pay | Admitting: Unknown Physician Specialty

## 2008-01-23 ENCOUNTER — Encounter: Payer: Self-pay | Admitting: Family Medicine

## 2008-02-01 LAB — CBC WITH DIFFERENTIAL (CANCER CENTER ONLY)
BASO#: 0 10*3/uL (ref 0.0–0.2)
BASO%: 0.6 % (ref 0.0–2.0)
EOS%: 2.5 % (ref 0.0–7.0)
HGB: 9.7 g/dL — ABNORMAL LOW (ref 11.6–15.9)
LYMPH#: 1.5 10*3/uL (ref 0.9–3.3)
MCHC: 32.2 g/dL (ref 32.0–36.0)
NEUT#: 3.2 10*3/uL (ref 1.5–6.5)
RBC: 3.88 10*6/uL (ref 3.70–5.32)

## 2008-02-22 ENCOUNTER — Encounter: Payer: Self-pay | Admitting: Family Medicine

## 2008-02-22 LAB — CBC WITH DIFFERENTIAL (CANCER CENTER ONLY)
BASO%: 0.6 % (ref 0.0–2.0)
EOS%: 2.2 % (ref 0.0–7.0)
HCT: 33.2 % — ABNORMAL LOW (ref 34.8–46.6)
LYMPH%: 24.4 % (ref 14.0–48.0)
MCHC: 31.4 g/dL — ABNORMAL LOW (ref 32.0–36.0)
MCV: 77 fL — ABNORMAL LOW (ref 81–101)
MONO%: 10.3 % (ref 0.0–13.0)
NEUT%: 62.5 % (ref 39.6–80.0)
RDW: 17.7 % — ABNORMAL HIGH (ref 10.5–14.6)

## 2008-02-23 LAB — IRON AND TIBC: UIBC: 367 ug/dL

## 2008-02-29 ENCOUNTER — Ambulatory Visit: Payer: Self-pay | Admitting: Oncology

## 2008-03-06 ENCOUNTER — Ambulatory Visit: Payer: Self-pay | Admitting: Family Medicine

## 2008-03-12 ENCOUNTER — Ambulatory Visit: Payer: Self-pay | Admitting: Family Medicine

## 2008-03-14 LAB — CBC WITH DIFFERENTIAL (CANCER CENTER ONLY)
BASO%: 0.5 % (ref 0.0–2.0)
EOS%: 1.7 % (ref 0.0–7.0)
LYMPH#: 1.5 10*3/uL (ref 0.9–3.3)
MCHC: 31.5 g/dL — ABNORMAL LOW (ref 32.0–36.0)
NEUT#: 3.1 10*3/uL (ref 1.5–6.5)
NEUT%: 59.2 % (ref 39.6–80.0)
RDW: 17.2 % — ABNORMAL HIGH (ref 10.5–14.6)

## 2008-04-03 ENCOUNTER — Encounter: Payer: Self-pay | Admitting: Family Medicine

## 2008-04-23 ENCOUNTER — Ambulatory Visit: Payer: Self-pay | Admitting: Oncology

## 2008-05-15 LAB — CBC WITH DIFFERENTIAL (CANCER CENTER ONLY)
BASO#: 0 10*3/uL (ref 0.0–0.2)
Eosinophils Absolute: 0.1 10*3/uL (ref 0.0–0.5)
HCT: 38.1 % (ref 34.8–46.6)
HGB: 12.3 g/dL (ref 11.6–15.9)
LYMPH#: 1.5 10*3/uL (ref 0.9–3.3)
LYMPH%: 32.6 % (ref 14.0–48.0)
MCH: 26.7 pg (ref 26.0–34.0)
MCHC: 32.4 g/dL (ref 32.0–36.0)
MCV: 83 fL (ref 81–101)
MONO#: 0.6 10*3/uL (ref 0.1–0.9)
MONO%: 11.6 % (ref 0.0–13.0)
Platelets: 209 10*3/uL (ref 145–400)
RBC: 4.61 10*6/uL (ref 3.70–5.32)
WBC: 4.7 10*3/uL (ref 3.9–10.0)

## 2008-06-05 LAB — CBC WITH DIFFERENTIAL (CANCER CENTER ONLY)
Eosinophils Absolute: 0.1 10*3/uL (ref 0.0–0.5)
HCT: 35.7 % (ref 34.8–46.6)
LYMPH%: 27.5 % (ref 14.0–48.0)
MCH: 27.3 pg (ref 26.0–34.0)
MCV: 84 fL (ref 81–101)
MONO#: 0.6 10*3/uL (ref 0.1–0.9)
NEUT%: 59.9 % (ref 39.6–80.0)
RBC: 4.26 10*6/uL (ref 3.70–5.32)
RDW: 17.5 % — ABNORMAL HIGH (ref 10.5–14.6)
WBC: 6.1 10*3/uL (ref 3.9–10.0)

## 2008-06-25 ENCOUNTER — Ambulatory Visit: Payer: Self-pay | Admitting: Oncology

## 2008-06-26 ENCOUNTER — Encounter: Payer: Self-pay | Admitting: Family Medicine

## 2008-06-26 LAB — CBC WITH DIFFERENTIAL (CANCER CENTER ONLY)
BASO#: 0 10*3/uL (ref 0.0–0.2)
Eosinophils Absolute: 0.2 10*3/uL (ref 0.0–0.5)
HGB: 11.4 g/dL — ABNORMAL LOW (ref 11.6–15.9)
LYMPH#: 1.7 10*3/uL (ref 0.9–3.3)
MONO#: 0.6 10*3/uL (ref 0.1–0.9)
MONO%: 10.7 % (ref 0.0–13.0)
NEUT#: 3.1 10*3/uL (ref 1.5–6.5)
Platelets: 269 10*3/uL (ref 145–400)
RBC: 4.08 10*6/uL (ref 3.70–5.32)
WBC: 5.5 10*3/uL (ref 3.9–10.0)

## 2008-07-08 ENCOUNTER — Ambulatory Visit: Payer: Self-pay | Admitting: Family Medicine

## 2008-07-08 DIAGNOSIS — G609 Hereditary and idiopathic neuropathy, unspecified: Secondary | ICD-10-CM | POA: Insufficient documentation

## 2008-07-10 LAB — CONVERTED CEMR LAB
Albumin: 3.3 g/dL — ABNORMAL LOW (ref 3.5–5.2)
Calcium: 9 mg/dL (ref 8.4–10.5)
Glucose, Bld: 84 mg/dL (ref 70–99)
Hgb A1c MFr Bld: 5.3 % (ref 4.6–6.5)
Potassium: 5 meq/L (ref 3.5–5.1)
TSH: 3.68 microintl units/mL (ref 0.35–5.50)

## 2008-07-17 LAB — CBC WITH DIFFERENTIAL (CANCER CENTER ONLY)
BASO%: 0.5 % (ref 0.0–2.0)
EOS%: 2.3 % (ref 0.0–7.0)
LYMPH#: 1.6 10*3/uL (ref 0.9–3.3)
MCH: 28.6 pg (ref 26.0–34.0)
MCHC: 32.8 g/dL (ref 32.0–36.0)
MONO%: 9.8 % (ref 0.0–13.0)
NEUT#: 3.9 10*3/uL (ref 1.5–6.5)
Platelets: 245 10*3/uL (ref 145–400)

## 2008-08-09 ENCOUNTER — Ambulatory Visit: Payer: Self-pay | Admitting: Oncology

## 2008-08-09 LAB — CBC WITH DIFFERENTIAL (CANCER CENTER ONLY)
Eosinophils Absolute: 0.1 10*3/uL (ref 0.0–0.5)
HGB: 11.7 g/dL (ref 11.6–15.9)
LYMPH%: 22.5 % (ref 14.0–48.0)
MCV: 86 fL (ref 81–101)
MONO#: 0.5 10*3/uL (ref 0.1–0.9)
Platelets: 241 10*3/uL (ref 145–400)
RBC: 4 10*6/uL (ref 3.70–5.32)
WBC: 6 10*3/uL (ref 3.9–10.0)

## 2008-08-28 ENCOUNTER — Encounter: Payer: Self-pay | Admitting: Family Medicine

## 2008-08-28 LAB — CBC WITH DIFFERENTIAL (CANCER CENTER ONLY)
Eosinophils Absolute: 0.2 10*3/uL (ref 0.0–0.5)
LYMPH#: 1.6 10*3/uL (ref 0.9–3.3)
MCH: 29.1 pg (ref 26.0–34.0)
MONO#: 0.6 10*3/uL (ref 0.1–0.9)
MONO%: 11.9 % (ref 0.0–13.0)
NEUT#: 2.8 10*3/uL (ref 1.5–6.5)
Platelets: 258 10*3/uL (ref 145–400)
RBC: 4.34 10*6/uL (ref 3.70–5.32)
WBC: 5.2 10*3/uL (ref 3.9–10.0)

## 2008-08-29 LAB — IRON AND TIBC
%SAT: 84 % — ABNORMAL HIGH (ref 20–55)
Iron: 352 ug/dL — ABNORMAL HIGH (ref 42–145)
TIBC: 420 ug/dL (ref 250–470)
UIBC: 68 ug/dL

## 2008-08-29 LAB — FERRITIN: Ferritin: 32 ng/mL (ref 10–291)

## 2008-09-12 ENCOUNTER — Ambulatory Visit: Payer: Self-pay | Admitting: Oncology

## 2008-09-18 LAB — CBC WITH DIFFERENTIAL (CANCER CENTER ONLY)
BASO#: 0 10*3/uL (ref 0.0–0.2)
BASO%: 0.6 % (ref 0.0–2.0)
EOS%: 2.1 % (ref 0.0–7.0)
HCT: 35.1 % (ref 34.8–46.6)
HGB: 12 g/dL (ref 11.6–15.9)
LYMPH#: 1.6 10*3/uL (ref 0.9–3.3)
LYMPH%: 25.2 % (ref 14.0–48.0)
MCHC: 34.1 g/dL (ref 32.0–36.0)
MCV: 86 fL (ref 81–101)
NEUT%: 63.4 % (ref 39.6–80.0)
RDW: 14.2 % (ref 10.5–14.6)

## 2008-10-09 LAB — CBC WITH DIFFERENTIAL (CANCER CENTER ONLY)
BASO#: 0 10*3/uL (ref 0.0–0.2)
BASO%: 0.5 % (ref 0.0–2.0)
EOS%: 2.6 % (ref 0.0–7.0)
HCT: 33.6 % — ABNORMAL LOW (ref 34.8–46.6)
HGB: 11.4 g/dL — ABNORMAL LOW (ref 11.6–15.9)
MCH: 28.7 pg (ref 26.0–34.0)
MCHC: 33.9 g/dL (ref 32.0–36.0)
MONO%: 10.2 % (ref 0.0–13.0)
NEUT#: 3.4 10*3/uL (ref 1.5–6.5)
NEUT%: 58.7 % (ref 39.6–80.0)
RDW: 14.8 % — ABNORMAL HIGH (ref 10.5–14.6)

## 2008-10-25 ENCOUNTER — Ambulatory Visit: Payer: Self-pay | Admitting: Oncology

## 2008-10-30 LAB — CBC WITH DIFFERENTIAL (CANCER CENTER ONLY)
BASO#: 0 10*3/uL (ref 0.0–0.2)
BASO%: 0.6 % (ref 0.0–2.0)
Eosinophils Absolute: 0.1 10*3/uL (ref 0.0–0.5)
HCT: 31 % — ABNORMAL LOW (ref 34.8–46.6)
HGB: 10.3 g/dL — ABNORMAL LOW (ref 11.6–15.9)
LYMPH#: 1.4 10*3/uL (ref 0.9–3.3)
LYMPH%: 29.1 % (ref 14.0–48.0)
MCV: 82 fL (ref 81–101)
MONO#: 0.6 10*3/uL (ref 0.1–0.9)
NEUT%: 56.8 % (ref 39.6–80.0)
RBC: 3.77 10*6/uL (ref 3.70–5.32)
RDW: 14.6 % (ref 10.5–14.6)
WBC: 5 10*3/uL (ref 3.9–10.0)

## 2008-11-20 LAB — CBC WITH DIFFERENTIAL (CANCER CENTER ONLY)
BASO%: 0.4 % (ref 0.0–2.0)
Eosinophils Absolute: 0.1 10*3/uL (ref 0.0–0.5)
LYMPH#: 1.9 10*3/uL (ref 0.9–3.3)
LYMPH%: 28.2 % (ref 14.0–48.0)
MCV: 83 fL (ref 81–101)
MONO#: 0.6 10*3/uL (ref 0.1–0.9)
Platelets: 270 10*3/uL (ref 145–400)
RBC: 3.6 10*6/uL — ABNORMAL LOW (ref 3.70–5.32)
RDW: 14.1 % (ref 10.5–14.6)
WBC: 6.6 10*3/uL (ref 3.9–10.0)

## 2008-11-24 ENCOUNTER — Ambulatory Visit: Payer: Self-pay | Admitting: Cardiology

## 2008-11-24 ENCOUNTER — Inpatient Hospital Stay (HOSPITAL_COMMUNITY): Admission: EM | Admit: 2008-11-24 | Discharge: 2008-11-29 | Payer: Self-pay | Admitting: Emergency Medicine

## 2008-11-27 ENCOUNTER — Encounter: Payer: Self-pay | Admitting: Cardiology

## 2008-11-28 ENCOUNTER — Encounter: Payer: Self-pay | Admitting: Cardiology

## 2008-12-03 ENCOUNTER — Ambulatory Visit: Payer: Self-pay | Admitting: Internal Medicine

## 2008-12-09 ENCOUNTER — Ambulatory Visit: Payer: Self-pay | Admitting: Oncology

## 2008-12-09 ENCOUNTER — Ambulatory Visit: Payer: Self-pay | Admitting: Internal Medicine

## 2008-12-25 ENCOUNTER — Ambulatory Visit: Payer: Self-pay | Admitting: Internal Medicine

## 2009-01-02 ENCOUNTER — Ambulatory Visit: Payer: Self-pay | Admitting: Internal Medicine

## 2009-01-13 ENCOUNTER — Ambulatory Visit: Payer: Self-pay | Admitting: Internal Medicine

## 2009-01-16 ENCOUNTER — Encounter (HOSPITAL_COMMUNITY): Admission: RE | Admit: 2009-01-16 | Discharge: 2009-03-14 | Payer: Self-pay | Admitting: Oncology

## 2009-01-16 ENCOUNTER — Ambulatory Visit: Payer: Self-pay | Admitting: Oncology

## 2009-01-16 ENCOUNTER — Encounter: Payer: Self-pay | Admitting: Family Medicine

## 2009-01-16 LAB — CBC WITH DIFFERENTIAL/PLATELET
BASO%: 0.7 % (ref 0.0–2.0)
MCHC: 31.3 g/dL — ABNORMAL LOW (ref 31.5–36.0)
MONO#: 0.8 10*3/uL (ref 0.1–0.9)
RBC: 4.35 10*6/uL (ref 3.70–5.45)
WBC: 5.7 10*3/uL (ref 3.9–10.3)
lymph#: 1.1 10*3/uL (ref 0.9–3.3)
nRBC: 0 % (ref 0–0)

## 2009-01-16 LAB — IRON AND TIBC
%SAT: 9 % — ABNORMAL LOW (ref 20–55)
TIBC: 381 ug/dL (ref 250–470)
UIBC: 345 ug/dL

## 2009-01-16 LAB — FERRITIN: Ferritin: 49 ng/mL (ref 10–291)

## 2009-01-28 ENCOUNTER — Ambulatory Visit: Payer: Self-pay | Admitting: Family Medicine

## 2009-01-28 DIAGNOSIS — E039 Hypothyroidism, unspecified: Secondary | ICD-10-CM | POA: Insufficient documentation

## 2009-01-28 DIAGNOSIS — S329XXA Fracture of unspecified parts of lumbosacral spine and pelvis, initial encounter for closed fracture: Secondary | ICD-10-CM | POA: Insufficient documentation

## 2009-01-30 LAB — CONVERTED CEMR LAB
Free T4: 0.7 ng/dL (ref 0.6–1.6)
TSH: 3.04 microintl units/mL (ref 0.35–5.50)

## 2009-02-03 LAB — CBC WITH DIFFERENTIAL (CANCER CENTER ONLY)
BASO%: 0.5 % (ref 0.0–2.0)
Eosinophils Absolute: 0.1 10*3/uL (ref 0.0–0.5)
LYMPH%: 26.9 % (ref 14.0–48.0)
MCH: 24.4 pg — ABNORMAL LOW (ref 26.0–34.0)
MONO#: 0.6 10*3/uL (ref 0.1–0.9)
MONO%: 9.3 % (ref 0.0–13.0)
NEUT#: 3.8 10*3/uL (ref 1.5–6.5)
Platelets: 296 10*3/uL (ref 145–400)
RBC: 4 10*6/uL (ref 3.70–5.32)
RDW: 15 % — ABNORMAL HIGH (ref 10.5–14.6)
WBC: 6.1 10*3/uL (ref 3.9–10.0)

## 2009-02-04 LAB — TYPE & CROSSMATCH - CHCC SATELLITE

## 2009-02-07 LAB — CBC WITH DIFFERENTIAL (CANCER CENTER ONLY)
EOS%: 2.1 % (ref 0.0–7.0)
LYMPH%: 33 % (ref 14.0–48.0)
MCH: 26.6 pg (ref 26.0–34.0)
MCHC: 32.4 g/dL (ref 32.0–36.0)
MCV: 82 fL (ref 81–101)
MONO%: 11.7 % (ref 0.0–13.0)
NEUT#: 3.5 10*3/uL (ref 1.5–6.5)
Platelets: 271 10*3/uL (ref 145–400)

## 2009-02-18 ENCOUNTER — Ambulatory Visit: Payer: Self-pay | Admitting: Oncology

## 2009-02-18 LAB — CBC WITH DIFFERENTIAL (CANCER CENTER ONLY)
BASO#: 0.1 10*3/uL (ref 0.0–0.2)
EOS%: 1.6 % (ref 0.0–7.0)
Eosinophils Absolute: 0.1 10*3/uL (ref 0.0–0.5)
HCT: 42.3 % (ref 34.8–46.6)
HGB: 13.7 g/dL (ref 11.6–15.9)
LYMPH#: 1.5 10*3/uL (ref 0.9–3.3)
MCHC: 32.3 g/dL (ref 32.0–36.0)
MONO#: 0.8 10*3/uL (ref 0.1–0.9)
NEUT%: 66.6 % (ref 39.6–80.0)

## 2009-02-19 ENCOUNTER — Emergency Department (HOSPITAL_COMMUNITY): Admission: EM | Admit: 2009-02-19 | Discharge: 2009-02-19 | Payer: Self-pay | Admitting: Emergency Medicine

## 2009-02-20 ENCOUNTER — Telehealth: Payer: Self-pay | Admitting: Family Medicine

## 2009-03-03 LAB — CBC WITH DIFFERENTIAL (CANCER CENTER ONLY)
BASO#: 0.1 10*3/uL (ref 0.0–0.2)
EOS%: 2.1 % (ref 0.0–7.0)
HCT: 36.4 % (ref 34.8–46.6)
HGB: 11.7 g/dL (ref 11.6–15.9)
LYMPH%: 20.8 % (ref 14.0–48.0)
MCH: 26.6 pg (ref 26.0–34.0)
MCHC: 32 g/dL (ref 32.0–36.0)
MCV: 83 fL (ref 81–101)
MONO%: 11 % (ref 0.0–13.0)
NEUT%: 65.3 % (ref 39.6–80.0)

## 2009-03-20 ENCOUNTER — Ambulatory Visit: Payer: Self-pay | Admitting: Oncology

## 2009-03-24 ENCOUNTER — Encounter: Payer: Self-pay | Admitting: Family Medicine

## 2009-03-24 LAB — CBC WITH DIFFERENTIAL (CANCER CENTER ONLY)
BASO#: 0 10*3/uL (ref 0.0–0.2)
EOS%: 1.6 % (ref 0.0–7.0)
Eosinophils Absolute: 0.1 10*3/uL (ref 0.0–0.5)
HCT: 37.7 % (ref 34.8–46.6)
HGB: 11.9 g/dL (ref 11.6–15.9)
LYMPH#: 1.8 10*3/uL (ref 0.9–3.3)
MCHC: 31.7 g/dL — ABNORMAL LOW (ref 32.0–36.0)
MONO#: 0.6 10*3/uL (ref 0.1–0.9)
NEUT#: 4.2 10*3/uL (ref 1.5–6.5)
NEUT%: 62.5 % (ref 39.6–80.0)
RBC: 4.55 10*6/uL (ref 3.70–5.32)

## 2009-04-09 ENCOUNTER — Ambulatory Visit: Payer: Self-pay | Admitting: Family Medicine

## 2009-04-09 DIAGNOSIS — R32 Unspecified urinary incontinence: Secondary | ICD-10-CM

## 2009-04-09 DIAGNOSIS — N390 Urinary tract infection, site not specified: Secondary | ICD-10-CM

## 2009-04-09 LAB — CONVERTED CEMR LAB
Bilirubin Urine: NEGATIVE
Casts: 0 /lpf
Ketones, urine, test strip: NEGATIVE
Specific Gravity, Urine: 1.01
Urine crystals, microscopic: 0 /hpf
Urobilinogen, UA: 0.2
pH: 6

## 2009-04-10 ENCOUNTER — Encounter: Payer: Self-pay | Admitting: Family Medicine

## 2009-04-14 ENCOUNTER — Telehealth: Payer: Self-pay | Admitting: Family Medicine

## 2009-04-14 LAB — CBC WITH DIFFERENTIAL (CANCER CENTER ONLY)
BASO#: 0.1 10*3/uL (ref 0.0–0.2)
EOS%: 2 % (ref 0.0–7.0)
Eosinophils Absolute: 0.1 10*3/uL (ref 0.0–0.5)
HCT: 34.4 % — ABNORMAL LOW (ref 34.8–46.6)
HGB: 11.2 g/dL — ABNORMAL LOW (ref 11.6–15.9)
LYMPH%: 25.3 % (ref 14.0–48.0)
MCH: 26.9 pg (ref 26.0–34.0)
MCHC: 32.5 g/dL (ref 32.0–36.0)
MCV: 83 fL (ref 81–101)
MONO%: 10.2 % (ref 0.0–13.0)
NEUT%: 61.8 % (ref 39.6–80.0)
RBC: 4.17 10*6/uL (ref 3.70–5.32)

## 2009-04-15 ENCOUNTER — Encounter: Payer: Self-pay | Admitting: Family Medicine

## 2009-04-17 ENCOUNTER — Ambulatory Visit: Payer: Self-pay | Admitting: Family Medicine

## 2009-04-18 ENCOUNTER — Encounter: Payer: Self-pay | Admitting: Family Medicine

## 2009-05-01 ENCOUNTER — Ambulatory Visit: Payer: Self-pay | Admitting: Oncology

## 2009-05-05 LAB — CBC WITH DIFFERENTIAL (CANCER CENTER ONLY)
BASO%: 0.6 % (ref 0.0–2.0)
EOS%: 1.1 % (ref 0.0–7.0)
HCT: 35.9 % (ref 34.8–46.6)
LYMPH#: 1.3 10*3/uL (ref 0.9–3.3)
MONO#: 0.7 10*3/uL (ref 0.1–0.9)
NEUT#: 6.8 10*3/uL — ABNORMAL HIGH (ref 1.5–6.5)
NEUT%: 76.2 % (ref 39.6–80.0)
RDW: 17.3 % — ABNORMAL HIGH (ref 10.5–14.6)
WBC: 9 10*3/uL (ref 3.9–10.0)

## 2009-05-20 ENCOUNTER — Encounter: Payer: Self-pay | Admitting: Family Medicine

## 2009-05-26 LAB — CBC WITH DIFFERENTIAL (CANCER CENTER ONLY)
BASO%: 0.6 % (ref 0.0–2.0)
EOS%: 1.6 % (ref 0.0–7.0)
LYMPH#: 1.2 10*3/uL (ref 0.9–3.3)
LYMPH%: 22.4 % (ref 14.0–48.0)
MCHC: 32.4 g/dL (ref 32.0–36.0)
MCV: 77 fL — ABNORMAL LOW (ref 81–101)
MONO#: 0.6 10*3/uL (ref 0.1–0.9)
Platelets: 269 10*3/uL (ref 145–400)
RDW: 16.5 % — ABNORMAL HIGH (ref 10.5–14.6)
WBC: 5.5 10*3/uL (ref 3.9–10.0)

## 2009-06-11 ENCOUNTER — Ambulatory Visit: Payer: Self-pay | Admitting: Family Medicine

## 2009-06-11 ENCOUNTER — Ambulatory Visit: Payer: Self-pay | Admitting: Oncology

## 2009-06-11 DIAGNOSIS — R42 Dizziness and giddiness: Secondary | ICD-10-CM

## 2009-06-17 ENCOUNTER — Telehealth: Payer: Self-pay | Admitting: Family Medicine

## 2009-06-17 LAB — CONVERTED CEMR LAB
ALT: 13 units/L (ref 0–35)
AST: 22 units/L (ref 0–37)
Alkaline Phosphatase: 63 units/L (ref 39–117)
Eosinophils Relative: 1 % (ref 0.0–5.0)
GFR calc non Af Amer: 63.53 mL/min (ref >60)
HCT: 29.7 % — ABNORMAL LOW (ref 36.0–46.0)
Hemoglobin: 9.2 g/dL — ABNORMAL LOW (ref 12.0–15.0)
Lymphocytes Relative: 15.6 % (ref 12.0–46.0)
Lymphs Abs: 1 10*3/uL (ref 0.7–4.0)
Monocytes Relative: 11.6 % (ref 3.0–12.0)
Neutro Abs: 4.3 10*3/uL (ref 1.4–7.7)
Platelets: 293 10*3/uL (ref 150.0–400.0)
Potassium: 5.3 meq/L — ABNORMAL HIGH (ref 3.5–5.1)
Sodium: 137 meq/L (ref 135–145)
TSH: 2.59 microintl units/mL (ref 0.35–5.50)
Total Bilirubin: 0.3 mg/dL (ref 0.3–1.2)
WBC: 6.1 10*3/uL (ref 4.5–10.5)

## 2009-06-18 LAB — CBC WITH DIFFERENTIAL (CANCER CENTER ONLY)
BASO#: 0 10*3/uL (ref 0.0–0.2)
HCT: 29.6 % — ABNORMAL LOW (ref 34.8–46.6)
HGB: 9.6 g/dL — ABNORMAL LOW (ref 11.6–15.9)
LYMPH#: 1.1 10*3/uL (ref 0.9–3.3)
MONO#: 0.6 10*3/uL (ref 0.1–0.9)
NEUT#: 2.7 10*3/uL (ref 1.5–6.5)
NEUT%: 59.7 % (ref 39.6–80.0)
RBC: 3.97 10*6/uL (ref 3.70–5.32)
WBC: 4.6 10*3/uL (ref 3.9–10.0)

## 2009-06-23 ENCOUNTER — Ambulatory Visit: Payer: Self-pay | Admitting: Family Medicine

## 2009-06-23 ENCOUNTER — Encounter: Payer: Self-pay | Admitting: Family Medicine

## 2009-06-23 DIAGNOSIS — M546 Pain in thoracic spine: Secondary | ICD-10-CM | POA: Insufficient documentation

## 2009-06-23 LAB — CBC WITH DIFFERENTIAL (CANCER CENTER ONLY)
BASO#: 0.1 10*3/uL (ref 0.0–0.2)
EOS%: 1.4 % (ref 0.0–7.0)
Eosinophils Absolute: 0.1 10*3/uL (ref 0.0–0.5)
HCT: 28.3 % — ABNORMAL LOW (ref 34.8–46.6)
HGB: 9.2 g/dL — ABNORMAL LOW (ref 11.6–15.9)
LYMPH#: 1.2 10*3/uL (ref 0.9–3.3)
MCH: 24.1 pg — ABNORMAL LOW (ref 26.0–34.0)
MCHC: 32.5 g/dL (ref 32.0–36.0)
MONO%: 13.9 % — ABNORMAL HIGH (ref 0.0–13.0)
NEUT#: 5.3 10*3/uL (ref 1.5–6.5)
NEUT%: 68.6 % (ref 39.6–80.0)
RBC: 3.81 10*6/uL (ref 3.70–5.32)

## 2009-06-25 ENCOUNTER — Ambulatory Visit: Payer: Self-pay | Admitting: Family Medicine

## 2009-06-25 ENCOUNTER — Telehealth: Payer: Self-pay | Admitting: Family Medicine

## 2009-06-25 DIAGNOSIS — E875 Hyperkalemia: Secondary | ICD-10-CM | POA: Insufficient documentation

## 2009-06-26 LAB — CONVERTED CEMR LAB: Potassium: 4.6 meq/L (ref 3.5–5.1)

## 2009-06-27 ENCOUNTER — Encounter: Payer: Self-pay | Admitting: Family Medicine

## 2009-07-02 ENCOUNTER — Encounter: Payer: Self-pay | Admitting: Family Medicine

## 2009-07-02 LAB — CBC WITH DIFFERENTIAL (CANCER CENTER ONLY)
BASO%: 0.4 % (ref 0.0–2.0)
HGB: 9.3 g/dL — ABNORMAL LOW (ref 11.6–15.9)
LYMPH#: 1.1 10*3/uL (ref 0.9–3.3)
MCV: 72 fL — ABNORMAL LOW (ref 81–101)
MONO#: 0.5 10*3/uL (ref 0.1–0.9)
Platelets: 408 10*3/uL — ABNORMAL HIGH (ref 145–400)
RBC: 4 10*6/uL (ref 3.70–5.32)
RDW: 17.1 % — ABNORMAL HIGH (ref 10.5–14.6)

## 2009-07-02 LAB — IRON AND TIBC
%SAT: 5 % — ABNORMAL LOW (ref 20–55)
Iron: 19 ug/dL — ABNORMAL LOW (ref 42–145)
TIBC: 386 ug/dL (ref 250–470)
UIBC: 367 ug/dL

## 2009-07-08 ENCOUNTER — Encounter: Payer: Self-pay | Admitting: Family Medicine

## 2009-07-08 LAB — CBC WITH DIFFERENTIAL (CANCER CENTER ONLY)
EOS%: 1.3 % (ref 0.0–7.0)
MCH: 22.9 pg — ABNORMAL LOW (ref 26.0–34.0)
MCHC: 32.1 g/dL (ref 32.0–36.0)
MONO%: 10.6 % (ref 0.0–13.0)
NEUT#: 2.9 10*3/uL (ref 1.5–6.5)
Platelets: 349 10*3/uL (ref 145–400)

## 2009-07-09 ENCOUNTER — Encounter (HOSPITAL_COMMUNITY): Admission: RE | Admit: 2009-07-09 | Discharge: 2009-10-07 | Payer: Self-pay | Admitting: Oncology

## 2009-07-10 LAB — TYPE & CROSSMATCH - CHCC SATELLITE

## 2009-07-11 ENCOUNTER — Ambulatory Visit: Payer: Self-pay | Admitting: Oncology

## 2009-07-15 ENCOUNTER — Encounter: Payer: Self-pay | Admitting: Family Medicine

## 2009-07-15 LAB — CBC WITH DIFFERENTIAL/PLATELET
Basophils Absolute: 0 10*3/uL (ref 0.0–0.1)
Eosinophils Absolute: 0.1 10*3/uL (ref 0.0–0.5)
HGB: 13.3 g/dL (ref 11.6–15.9)
LYMPH%: 30.3 % (ref 14.0–49.7)
MCV: 82.8 fL (ref 79.5–101.0)
MONO%: 12.3 % (ref 0.0–14.0)
NEUT#: 4.1 10*3/uL (ref 1.5–6.5)
Platelets: 250 10*3/uL (ref 145–400)
RDW: 24.5 % — ABNORMAL HIGH (ref 11.2–14.5)

## 2009-07-22 LAB — CBC WITH DIFFERENTIAL (CANCER CENTER ONLY)
Eosinophils Absolute: 0.1 10*3/uL (ref 0.0–0.5)
LYMPH%: 18.1 % (ref 14.0–48.0)
MCH: 26.8 pg (ref 26.0–34.0)
MCV: 81 fL (ref 81–101)
MONO%: 11.6 % (ref 0.0–13.0)
NEUT%: 67.4 % (ref 39.6–80.0)
Platelets: 298 10*3/uL (ref 145–400)
RBC: 5.35 10*6/uL — ABNORMAL HIGH (ref 3.70–5.32)

## 2009-07-23 LAB — ERYTHROPOIETIN: Erythropoietin: 43.7 m[IU]/mL — ABNORMAL HIGH (ref 2.6–34.0)

## 2009-08-06 ENCOUNTER — Ambulatory Visit: Payer: Self-pay | Admitting: Oncology

## 2009-08-06 ENCOUNTER — Telehealth (INDEPENDENT_AMBULATORY_CARE_PROVIDER_SITE_OTHER): Payer: Self-pay | Admitting: *Deleted

## 2009-08-06 ENCOUNTER — Encounter: Payer: Self-pay | Admitting: Family Medicine

## 2009-08-06 LAB — CBC WITH DIFFERENTIAL (CANCER CENTER ONLY)
BASO%: 0.7 % (ref 0.0–2.0)
EOS%: 2.2 % (ref 0.0–7.0)
HCT: 41.6 % (ref 34.8–46.6)
LYMPH%: 20.8 % (ref 14.0–48.0)
MCH: 27.1 pg (ref 26.0–34.0)
MCHC: 32.4 g/dL (ref 32.0–36.0)
MCV: 84 fL (ref 81–101)
MONO%: 10.1 % (ref 0.0–13.0)
NEUT%: 66.2 % (ref 39.6–80.0)
RDW: 24.3 % — ABNORMAL HIGH (ref 10.5–14.6)

## 2009-08-06 LAB — IRON AND TIBC
%SAT: 38 % (ref 20–55)
TIBC: 279 ug/dL (ref 250–470)

## 2009-08-13 ENCOUNTER — Telehealth: Payer: Self-pay | Admitting: Family Medicine

## 2009-08-18 ENCOUNTER — Encounter: Admission: RE | Admit: 2009-08-18 | Discharge: 2009-08-18 | Payer: Self-pay | Admitting: Family Medicine

## 2009-08-20 ENCOUNTER — Ambulatory Visit: Payer: Self-pay | Admitting: Family Medicine

## 2009-08-20 DIAGNOSIS — S22009A Unspecified fracture of unspecified thoracic vertebra, initial encounter for closed fracture: Secondary | ICD-10-CM | POA: Insufficient documentation

## 2009-08-26 ENCOUNTER — Encounter: Payer: Self-pay | Admitting: Family Medicine

## 2009-08-27 ENCOUNTER — Encounter: Payer: Self-pay | Admitting: Family Medicine

## 2009-09-02 ENCOUNTER — Ambulatory Visit: Payer: Self-pay | Admitting: Oncology

## 2009-09-02 LAB — CBC WITH DIFFERENTIAL (CANCER CENTER ONLY)
BASO#: 0 10*3/uL (ref 0.0–0.2)
Eosinophils Absolute: 0.1 10*3/uL (ref 0.0–0.5)
HGB: 13.9 g/dL (ref 11.6–15.9)
LYMPH#: 1.1 10*3/uL (ref 0.9–3.3)
MONO#: 0.4 10*3/uL (ref 0.1–0.9)
NEUT#: 2.6 10*3/uL (ref 1.5–6.5)
RBC: 4.88 10*6/uL (ref 3.70–5.32)

## 2009-09-04 ENCOUNTER — Ambulatory Visit: Payer: Self-pay | Admitting: Family Medicine

## 2009-09-06 ENCOUNTER — Encounter: Admission: RE | Admit: 2009-09-06 | Discharge: 2009-09-06 | Payer: Self-pay | Admitting: *Deleted

## 2009-09-23 LAB — CBC WITH DIFFERENTIAL (CANCER CENTER ONLY)
BASO%: 0.5 % (ref 0.0–2.0)
EOS%: 2.1 % (ref 0.0–7.0)
HGB: 12.8 g/dL (ref 11.6–15.9)
LYMPH#: 1.6 10*3/uL (ref 0.9–3.3)
MCHC: 32.8 g/dL (ref 32.0–36.0)
NEUT#: 3.2 10*3/uL (ref 1.5–6.5)
RDW: 16 % — ABNORMAL HIGH (ref 10.5–14.6)
WBC: 5.5 10*3/uL (ref 3.9–10.0)

## 2009-10-09 ENCOUNTER — Ambulatory Visit: Payer: Self-pay | Admitting: Oncology

## 2009-10-14 ENCOUNTER — Encounter: Payer: Self-pay | Admitting: Family Medicine

## 2009-10-14 LAB — CBC WITH DIFFERENTIAL (CANCER CENTER ONLY)
BASO%: 0.5 % (ref 0.0–2.0)
Eosinophils Absolute: 0.1 10*3/uL (ref 0.0–0.5)
LYMPH#: 1.2 10*3/uL (ref 0.9–3.3)
MONO#: 0.5 10*3/uL (ref 0.1–0.9)
NEUT#: 3.9 10*3/uL (ref 1.5–6.5)
Platelets: 298 10*3/uL (ref 145–400)
RBC: 4.17 10*6/uL (ref 3.70–5.32)
RDW: 14.3 % (ref 10.5–14.6)
WBC: 5.7 10*3/uL (ref 3.9–10.0)

## 2009-10-14 LAB — CMP (CANCER CENTER ONLY)
ALT(SGPT): 15 U/L (ref 10–47)
Albumin: 3.5 g/dL (ref 3.3–5.5)
CO2: 28 mEq/L (ref 18–33)
Calcium: 9 mg/dL (ref 8.0–10.3)
Chloride: 100 mEq/L (ref 98–108)
Glucose, Bld: 111 mg/dL (ref 73–118)
Potassium: 4.5 mEq/L (ref 3.3–4.7)
Sodium: 135 mEq/L (ref 128–145)
Total Bilirubin: 0.5 mg/dl (ref 0.20–1.60)
Total Protein: 6.4 g/dL (ref 6.4–8.1)

## 2009-10-14 LAB — LACTATE DEHYDROGENASE: LDH: 157 U/L (ref 94–250)

## 2009-11-06 ENCOUNTER — Ambulatory Visit: Payer: Self-pay | Admitting: Oncology

## 2009-11-11 LAB — CBC WITH DIFFERENTIAL (CANCER CENTER ONLY)
BASO%: 0.3 % (ref 0.0–2.0)
EOS%: 1.1 % (ref 0.0–7.0)
HCT: 34.4 % — ABNORMAL LOW (ref 34.8–46.6)
LYMPH#: 1.6 10*3/uL (ref 0.9–3.3)
MCHC: 33.8 g/dL (ref 32.0–36.0)
NEUT%: 64.1 % (ref 39.6–80.0)
Platelets: 246 10*3/uL (ref 145–400)
RDW: 13.5 % (ref 10.5–14.6)

## 2009-11-21 ENCOUNTER — Telehealth: Payer: Self-pay | Admitting: Family Medicine

## 2009-12-03 ENCOUNTER — Ambulatory Visit: Payer: Self-pay | Admitting: Oncology

## 2009-12-23 ENCOUNTER — Telehealth: Payer: Self-pay | Admitting: Family Medicine

## 2009-12-24 ENCOUNTER — Ambulatory Visit: Payer: Self-pay | Admitting: Family Medicine

## 2010-01-01 ENCOUNTER — Ambulatory Visit: Payer: Self-pay | Admitting: Oncology

## 2010-01-06 LAB — CBC WITH DIFFERENTIAL (CANCER CENTER ONLY)
BASO#: 0 10*3/uL (ref 0.0–0.2)
EOS%: 2 % (ref 0.0–7.0)
Eosinophils Absolute: 0.1 10*3/uL (ref 0.0–0.5)
LYMPH%: 23.8 % (ref 14.0–48.0)
MCH: 30.2 pg (ref 26.0–34.0)
MCHC: 33.8 g/dL (ref 32.0–36.0)
MCV: 89 fL (ref 81–101)
MONO%: 10 % (ref 0.0–13.0)
NEUT#: 3.8 10*3/uL (ref 1.5–6.5)
Platelets: 224 10*3/uL (ref 145–400)
RBC: 3.77 10*6/uL (ref 3.70–5.32)

## 2010-01-27 ENCOUNTER — Encounter (HOSPITAL_COMMUNITY)
Admission: RE | Admit: 2010-01-27 | Discharge: 2010-03-13 | Payer: Self-pay | Source: Home / Self Care | Attending: Oncology | Admitting: Oncology

## 2010-01-27 ENCOUNTER — Encounter: Payer: Self-pay | Admitting: Family Medicine

## 2010-01-27 LAB — CBC WITH DIFFERENTIAL (CANCER CENTER ONLY)
BASO%: 0.4 % (ref 0.0–2.0)
LYMPH#: 1.1 10*3/uL (ref 0.9–3.3)
LYMPH%: 22.6 % (ref 14.0–48.0)
MONO#: 0.5 10*3/uL (ref 0.1–0.9)
NEUT#: 3.2 10*3/uL (ref 1.5–6.5)
Platelets: 221 10*3/uL (ref 145–400)
RDW: 13.4 % (ref 10.5–14.6)
WBC: 5 10*3/uL (ref 3.9–10.0)

## 2010-01-27 LAB — IRON AND TIBC
%SAT: 11 % — ABNORMAL LOW (ref 20–55)
Iron: 35 ug/dL — ABNORMAL LOW (ref 42–145)
TIBC: 333 ug/dL (ref 250–470)

## 2010-02-10 ENCOUNTER — Telehealth: Payer: Self-pay | Admitting: Family Medicine

## 2010-02-16 ENCOUNTER — Ambulatory Visit: Payer: Self-pay | Admitting: Oncology

## 2010-02-17 ENCOUNTER — Encounter: Payer: Self-pay | Admitting: Family Medicine

## 2010-02-17 LAB — CBC WITH DIFFERENTIAL/PLATELET
BASO%: 0.5 % (ref 0.0–2.0)
LYMPH%: 22.9 % (ref 14.0–49.7)
MCHC: 32.1 g/dL (ref 31.5–36.0)
MCV: 86.5 fL (ref 79.5–101.0)
MONO%: 10.5 % (ref 0.0–14.0)
Platelets: 191 10*3/uL (ref 145–400)
RBC: 5.35 10*6/uL (ref 3.70–5.45)
WBC: 7.2 10*3/uL (ref 3.9–10.3)

## 2010-03-02 ENCOUNTER — Telehealth: Payer: Self-pay | Admitting: Family Medicine

## 2010-03-10 ENCOUNTER — Encounter: Payer: Self-pay | Admitting: Family Medicine

## 2010-03-27 ENCOUNTER — Ambulatory Visit: Payer: Self-pay | Admitting: Oncology

## 2010-04-14 NOTE — Progress Notes (Signed)
Summary: Multiple issues  Phone Note Outgoing Call Call back at [phone]   Call placed by: Lewanda Rife Call placed to: Patient Summary of Call: Called pt re: her taking extra K. Appended note to you that pt was taking Centrum Silver and no add'l K. Also pt has never seen Hematologist and will go to whoever you suggest. Pt said Alprazolam gives her a headache when she wakes up. Lorazepam 0.5mg  did not bother her and helped her insomnia. Pt uses [facility] if pharmacy needed [phone]. Please advise.  Initial call taken by: Lewanda Rife LPN,  June 17, 2009 11:53 AM  Follow-up for Phone Call        will change to lorazepam --px written on EMR for call in she has [facility] Dr [facility]ed [facility] -- I want her to fu there -- does she want ref or to make her own appt?   stop the centrum silver check K level in 1 week please for hyperkalemia  Follow-up by: Judith Part MD,  June 17, 2009 12:40 PM  Additional Follow-up for Phone Call Additional follow up Details #1::        Medication phoned to [facility] as instructed. Refills on alprazolam discontinued.Left message for patient to call back. Lewanda Rife LPN  June 17, 1912 2:16 PM   Advised pt of all above, lab appt made for 06/25/09. She will call Dr. Santo Held office to schedule f/u appt. Additional Follow-up by: Lowella Petties CMA,  June 17, 2009 3:34 PM   New Allergies: * ALPRAZOLAM New/Updated Medications: LORAZEPAM 0.5 MG TABS (LORAZEPAM) 1 by mouth at bedtime as needed insomnia New Allergies: * ALPRAZOLAMPrescriptions: LORAZEPAM 0.5 MG TABS (LORAZEPAM) 1 by mouth at bedtime as needed insomnia  #30 x 2   Entered and Authorized by:   Judith Part MD   Signed by:   Lewanda Rife LPN on 78/29/5621   Method used:   Telephoned to ...       [facility] (retail)       [address]       [place], [place]  [address]       Ph: [phone] or [phone]       Fax: [phone]   RxID:    [id number]

## 2010-04-14 NOTE — Consult Note (Signed)
Summary: [facility]  [facility]   Imported By: Lanelle Bal 07/02/2009 11:42:31  _____________________________________________________________________  External Attachment:    Type:   Image     Comment:   External Document

## 2010-04-14 NOTE — Assessment & Plan Note (Signed)
Summary: WEAK   Vital Signs:  Patient profile:   77 year old female Height:      61 inches Weight:      109 pounds BMI:     20.67 Temp:     98 degrees F oral Pulse rate:   88 / minute Pulse rhythm:   regular BP sitting:   122 / 78  (left arm) Cuff size:   regular  Vitals Entered By: Delilah Shan CMA Duncan Dull) (June 23, 2009 3:29 PM) CC: Weak   History of Present Illness: 77 [place] complicated female new to me here for acute visit for multiple complaints, mainly worsening weakness.  Tiffany Austin has been following with hematology (Dr. Park Breed) for chronic microcytic anemia.  According to daughter, has been getting injections of aranesp which have been helping until recently. Hgb this morning was 9.2 and was supposed to get IV iron.  Daughter states that Dr. Santo Held office called and told her to folllow up with her primary doctor before receiving the iron because Tiffany Austin needs further work up.   Old notes and labs reviewed. Hgb in 10/10 was 12.6.  Pt states Tiffany Austin had a recent colonoscopy.  No signs of acute bleed, no blood in stool. TSH, b12 and folate all normal on 06/11/09.  Lately, Tiffany Austin feels to weak to do anything.   Her appetite is down, has lost 3 pounds since Tiffany Austin was here on 3/31.  Actually weight 9 pounds more than Tiffany Austin does today a year ago today. Food doesnt taste good, admits to being depressed and frustrated. Not sleeping well.  Upper back pain, between shoulder blades, closer to left shoulder. Not aggrevated by lifting arms over head.  Strength normal.  Has severe osteoporosis, no recent bone denisty but has multiple compression fractures, pelvic fracture in 09/10. Taking Boniva 2.5 mg monthly, ca/vit d-Tiffany Austin is unsure of dosges.  Current Medications (verified): 1)  Anacin 81 Mg  Tbec (Aspirin) .... Take 1 Tablet By Mouth Once A Day 2)  Daily Multiple Vitamins   Tabs (Multiple Vitamin) .... Take 1 Tablet By Mouth Once A Day 3)  Tylenol Arthritis Pain 650 Mg  Tbcr (Acetaminophen) .... Take 2  By Mouth Am and 2 By Mouth Pm 4)  Boniva 2.5 Mg  Tabs (Ibandronate Sodium) .... Take One By Mouth Monthly 5)  Vit D 400 Mg .... Take One By Mouth Daily 6)  Levothyroxine Sodium 25 Mcg Tabs (Levothyroxine Sodium) .... Take One By Mouth Daily 7)  Prilosec 20 Mg Cpdr (Omeprazole) .Marland Kitchen.. 1 By Mouth Once Daily in Am 8)  Calcium With Vitamin D 9)  Vicodin 5-500 Mg Tabs (Hydrocodone-Acetaminophen) .... 1/2 By Mouth Up To Every 6 Hours During The Day As Needed Pain Watch For Sedation 10)  Lorazepam 0.5 Mg Tabs (Lorazepam) .Marland Kitchen.. 1 By Mouth At Bedtime As Needed Insomnia 11)  Mirtazapine 7.5 Mg Tabs (Mirtazapine) .Marland Kitchen.. 1 Tab By Mouth Qhs  Allergies (verified): 1)  ! Miacalcin 2)  ! * Hrt 3)  ! Evista 4)  ! Boniva 5)  * Alprazolam  Review of Systems      See HPI General:  Complains of fatigue, malaise, weakness, and weight loss; denies chills and fever. GI:  Denies abdominal pain, bloody stools, nausea, and vomiting.  Physical Exam  General:  frail appearing elderly female in no distress   Mouth:  pharynx pink and moist.   Lungs:  Normal respiratory effort, chest expands symmetrically. Lungs are clear to auscultation, no crackles or wheezes. Heart:  Normal  rate and regular rhythm. S1 and S2 normal without gallop, murmur, click, rub or other extra sounds. Abdomen:  Bowel sounds positive,abdomen soft and non-tender without masses, organomegaly or hernias noted. no renal bruits  Msk:  throacic vertebrae TTP Extremities:  No clubbing, cyanosis, edema, or deformity noted with normal full range of motion of all joints.   Psych:  seems tired and down tearful   Impression & Recommendations:  Problem # 1:  WEAKNESS (ICD-780.Tiffany) Assessment Deteriorated Time spent with patient and daughter 35 minutes, more than 50% of this time was spent discussing labs, records and history with pt and her daughter.  Tiffany Austin is obviously losing weight due partially to decreased appetite.  I advised starting Remeron 7.5 mg  daily- hopefully to help with depression, insomnia and appetite.  Tiffany Austin will follow up with Dr. Milinda Antis or myself in a month to see if Tiffany Austin needs increased dosage.  Her weakenss apepars to have been extensively worked up (neuro consult pending).  Daughter wanted to know why Dr. Park Breed would not give her IV iron until Tiffany Austin saw [place], I advised that I would request records immediately because I do not know why.  Tiffany Austin will also keep her appt with Dr. Milinda Antis on weds because hopefully we will have records by then.    Problem # 2:  BACK PAIN, THORACIC REGION (ICD-724.1) Assessment: New Likely another compression fracture.  I advised trying something Forteo since Tiffany Austin has had so many high risk fractures to prevent more compression fractures.  Tiffany Austin will think about it.  ?possible MRI of spine or DEXA scan. Her updated medication list for this problem includes:    Anacin 81 Mg Tbec (Aspirin) .Marland Kitchen... Take 1 tablet by mouth once a day    Tylenol Arthritis Pain 650 Mg Tbcr (Acetaminophen) .Marland Kitchen... Take 2 by mouth am and 2 by mouth pm    Vicodin 5-500 Mg Tabs (Hydrocodone-acetaminophen) .Marland Kitchen... 1/2 by mouth up to every 6 hours during the day as needed pain watch for sedation  Complete Medication List: 1)  Anacin 81 Mg Tbec (Aspirin) .... Take 1 tablet by mouth once a day 2)  Daily Multiple Vitamins Tabs (Multiple vitamin) .... Take 1 tablet by mouth once a day 3)  Tylenol Arthritis Pain 650 Mg Tbcr (Acetaminophen) .... Take 2 by mouth am and 2 by mouth pm 4)  Boniva 2.5 Mg Tabs (Ibandronate sodium) .... Take one by mouth monthly 5)  Vit D 400 Mg  .... Take one by mouth daily 6)  Levothyroxine Sodium 25 Mcg Tabs (Levothyroxine sodium) .... Take one by mouth daily 7)  Prilosec 20 Mg Cpdr (Omeprazole) .Marland Kitchen.. 1 by mouth once daily in am 8)  Calcium With Vitamin D  9)  Vicodin 5-500 Mg Tabs (Hydrocodone-acetaminophen) .... 1/2 by mouth up to every 6 hours during the day as needed pain watch for sedation 10)  Lorazepam 0.5 Mg Tabs  (Lorazepam) .Marland Kitchen.. 1 by mouth at bedtime as needed insomnia 11)  Mirtazapine 7.5 Mg Tabs (Mirtazapine) .Marland Kitchen.. 1 tab by mouth qhs  Patient Instructions: 1)  Good to meet you. 2)  Please keep your appointment with Dr. Milinda Antis on [place]. 3)  You can follow up with me or her in one month to see how you are doing on your Remeron. 4)  I have asked our front office staff to get office notes from Dr. Park Breed from today. Prescriptions: MIRTAZAPINE 7.5 MG TABS (MIRTAZAPINE) 1 tab by mouth qhs  #30 x 0   Entered and Authorized  by:   Ruthe Mannan MD   Signed by:   Ruthe Mannan MD on 06/23/2009   Method used:   [facility]        [facility] (ret[facility]       [address]       [place], [place]  [address]       Ph: [phone] or [phone]       Fax: [phone]   RxID:   [id number]

## 2010-04-14 NOTE — Letter (Signed)
[facility]   Imported By: Lanelle Bal 04/15/2009 11:02:58  _____________________________________________________________________  External Attachment:    Type:   Image     Comment:   External Document

## 2010-04-14 NOTE — Progress Notes (Signed)
Summary: pt has extreme back pain  Phone Note Call from Patient   Caller: DaughterDelaine Lame  [phone] Call For: Ruthe Mannan MD Summary of Call: Pt is transferring to you from Dr Milinda Antis.  You have seen her one time.  She is in extreme back pain and her daughter is asking if pt should go for MRI.  She has an appt to see you on 6/8 but daughter doesnt want to wait that [place].  She prefers to go to  imaging  if MRI is indicated. Initial call taken by: Lowella Petties CMA,  August 13, 2009 10:13 AM  Follow-up for Phone Call        I cannot possibly say if an MRI is indicated since I have never seen her for this before.  Can try asking Dr. Milinda Antis first. Ruthe Mannan MD  August 13, 2009 10:18 AM  Ms Behan has primary problem of compression fractures if I remember correctly -- perhaps has new fx  ? consider plain films first   Follow-up by: Judith Part MD,  August 13, 2009 10:40 AM  Additional Follow-up for Phone Call Additional follow up Details #1::        ok with ordering plain filims. Order placed in emr. Ruthe Mannan MD  August 13, 2009 10:41 AM      Appended Document: pt has extreme back pain Advised pt's daughter, she will wait for call regarding x-ray appt.

## 2010-04-14 NOTE — Progress Notes (Signed)
  Phone Note From Other Clinic   Caller: Receptionist Summary of Call: Called to set up appt with Dr Welton Flakes, was told that she is out of the office until the end of April. The P.A. is also out on maternity leave. They recommended that Dr Milinda Antis call office directly to speak to Bonita Quin , nurse manager to have this patient worked in with Dr Gaylyn Rong on 07/01/2009 when he will be covering for Dr Welton Flakes while she is out of  the  office. their ohone # is [phone]. Also Ms Chabot is scheduled to come in here today for a Potassium level if you need to add anything else to the lab order. Initial call taken by: Carlton Adam,  June 25, 2009 11:27 AM  Follow-up for Phone Call        thank you I spoke to [facility] and they will call Ms Boschert to set her up for appt either on april 19, or 20th with covering physician  they will contact pt  I thanked them very much for the effort  Shirlee Limerick- please let her know they will be calling her - thanks  Follow-up by: Judith Part MD,  June 25, 2009 1:19 PM  Additional Follow-up for Phone Call Additional follow up Details #1::        Patient has been scheduled with Oncology Dr Gaylyn Rong on 07/02/2009. Additional Follow-up by: Carlton Adam,  June 25, 2009 3:21 PM

## 2010-04-14 NOTE — Assessment & Plan Note (Signed)
Summary: OSTEOPOROSIS SHOT   Nurse Visit   Allergies: 1)  ! Miacalcin 2)  ! * Hrt 3)  ! Evista 4)  ! Boniva 5)  * Alprazolam  Medication Administration  Injection # 1:    Medication: Prolia 60mg     Diagnosis: OSTEOPOROSIS (ICD-733.00)    Route: SQ    Site: L deltoid    Exp Date: 01/14/2011    Lot #: [id number]    Mfr: [email or web address]    Patient tolerated injection without complications    Given by: Mervin Hack CMA Duncan Dull) (September 04, 2009 3:27 PM)  Orders Added: 1)  Prolia 60mg  [J3590] 2)  Admin of Therapeutic Inj  intramuscular or subcutaneous [96372]   Medication Administration  Injection # 1:    Medication: Prolia 60mg     Diagnosis: OSTEOPOROSIS (ICD-733.00)    Route: SQ    Site: L deltoid    Exp Date: 01/14/2011    Lot #: [id number]    Mfr: [email or web address]    Patient tolerated injection without complications    Given by: Mervin Hack CMA Duncan Dull) (September 04, 2009 3:27 PM)  Orders Added: 1)  Prolia 60mg  [J3590] 2)  Admin of Therapeutic Inj  intramuscular or subcutaneous [[id number]]

## 2010-04-14 NOTE — Letter (Signed)
[facility]   Imported By: Lanelle Bal 07/10/2009 13:59:20  _____________________________________________________________________  External Attachment:    Type:   Image     Comment:   External Document

## 2010-04-14 NOTE — Miscellaneous (Signed)
Summary: [facility] Orders [facility] Eval  [facility] Orders [facility] Eval   Imported By: Lanelle Bal 04/23/2009 11:44:46  _____________________________________________________________________  External Attachment:    Type:   Image     Comment:   External Document

## 2010-04-14 NOTE — Assessment & Plan Note (Signed)
Summary: NOT FEELING WELL/DLO   Vital Signs:  Patient profile:   77 year old female Height:      61 inches Weight:      112 pounds BMI:     21.24 Temp:     97.5 degrees F oral Pulse rate:   72 / minute Pulse rhythm:   regular BP sitting:   108 / 70  (left arm) Cuff size:   regular  Vitals Entered By: Lewanda Rife LPN (June 11, 2009 12:10 PM) CC: Not feeling well, dizzy   History of Present Illness: severely fatgued and dizzy all time  just cannot "go" dizziness is off and on - sometimes worse   is too tired to go to her PT - but it was helping   has had many workups for dizziness  thinks she has seen neuro and had many scans  never found out why she is dizzy -  never found out why  no falls lately  is trying to avoid that   does not know if depression is making her tired  is feeling down  not tearful  no real worries -- except her health  has never had depression in the past or med is frustrated   no other symptoms at all  no symptoms of uti   is generally a little sob- no diff from her baseline no chest pain  no edema   problems sleeping -- every night - for a [place] time just reads all night  sleeps - dozes during the day  occas gets up to urinate at night 1-2 per night  no otc meds work- like benadryl  alprazolam helps a lot when she has it   is not taking iron because it makes her sick anemia is much better despite this -- last vist this mo - did not need a shot   back pain- can't take anti inflammatories tylenol does not help at all  does not go to [facility] any more for back pain  no trouble with narcotics in past - ? what she has taken   uses walker at home - and sits down when she needs to to prevent falling      Allergies: 1)  ! Miacalcin 2)  ! * Hrt 3)  ! Evista 4)  ! Boniva  Past History:  Past Surgical History: Last updated: 02/03/2008 Tonsillectomy Hysterectomy- fibroids, ovaries intract- age 25 Bladder repair with  hyst Appendectomy Cataract extraction- implant Dexa- slightly improved osteopenia (06/2000) Compound fracture spine- T9,L1 (07/2000) L2,L3 compound fracture in past Cardiolite- neg EF 65% (08/2001) Dexa- OP at femeral neck (01/2003) Colonoscopy- divertics, hems (02/2004) Dexa- osteopenia at femoral neck (11/206) Foot surgery (2007) Blood transfusion (10/2005) EGD- gastric ulcer (11/2005) EGD-schatzki ring, HH, mucosal abnormalities, bx- H pylori (02/2006) EGD- schatzki ring, HH (11/09)  Family History: Last updated: 2007-06-03 Father: died of stroke Mother: died age 75 Siblings: 3 brothers- all died of MI's, 2 sisters GF with colon cancer  Social History: Last updated: June 03, 2007 Marital Status: widowed Children: 2 Occupation: retired  Risk Factors: Smoking Status: quit (2007-06-03)  Past Medical History: GERD Osteoporosis Peptic ulcer disease anemia of chronic dz and iron deficiency symptoms of periph neuropathy varicose veins  dizziness - chronic   Review of Systems General:  Complains of fatigue and malaise; denies chills, fever, and loss of appetite. Eyes:  Denies blurring and eye pain. ENT:  Denies sinus pressure and sore throat. CV:  Denies chest pain or discomfort, palpitations, shortness  of breath with exertion, and swelling of feet. Resp:  Denies cough, shortness of breath, and wheezing. GI:  Denies abdominal pain, bloody stools, change in bowel habits, indigestion, nausea, and vomiting. GU:  Denies abnormal vaginal bleeding, discharge, and dysuria. MS:  Complains of low back pain, mid back pain, and stiffness; denies joint pain, joint redness, joint swelling, and cramps. Derm:  Denies itching, lesion(s), poor wound healing, and rash. Neuro:  Denies numbness and tingling. Psych:  Complains of depression; denies panic attacks, sense of great danger, and suicidal thoughts/plans. Endo:  Denies cold intolerance, excessive thirst, excessive urination, and heat  intolerance. Heme:  Denies abnormal bruising, bleeding, and enlarge lymph nodes.  Physical Exam  General:  frail appearing elderly female in no distress  using walker today Head:  normocephalic, atraumatic, and no abnormalities observed.   Eyes:  vision grossly intact, pupils equal, pupils round, and pupils reactive to light.  no conjunctival pallor, injection or icterus  Ears:  R ear normal and L ear normal.  - scant cerumen Nose:  no nasal discharge.   Mouth:  pharynx pink and moist.   Neck:  supple with full rom and no masses or thyromegally, no JVD or carotid bruit  Chest Wall:  No deformities, masses, or tenderness noted. Lungs:  CTA with distant bs at bases  no wheeze  Heart:  Normal rate and regular rhythm. S1 and S2 normal without gallop, murmur, click, rub or other extra sounds. Abdomen:  Bowel sounds positive,abdomen soft and non-tender without masses, organomegaly or hernias noted. no renal bruits  Msk:  tender TS and LS diffusely - mild  no new joint changes  Pulses:  R and L carotid,radial,femoral,dorsalis pedis and posterior tibial pulses are full and equal bilaterally Extremities:  No clubbing, cyanosis, edema, or deformity noted with normal full range of motion of all joints.   Neurologic:  cranial nerves II-XII intact, strength normal in all extremities, sensation intact to light touch, DTRs symmetrical and normal some mild backward drift on rhomberg does need some assistance to rise from sitting Skin:  Intact without suspicious lesions or rashes Cervical Nodes:  No lymphadenopathy noted Inguinal Nodes:  No significant adenopathy Psych:  seems tired and down not tearful   Impression & Recommendations:  Problem # 1:  DIZZINESS, CHRONIC (ICD-780.4) Assessment New per pt worsening with no etiology/ and balance problems enc to continue PT and using walker ref to neuro Orders: Neurology Referral (Neuro)  Problem # 2:  HYPOTHYROIDISM (ICD-244.9) Assessment:  Unchanged  in light of fatigue- check labs today no wt changes  Her updated medication list for this problem includes:    Levothyroxine Sodium 25 Mcg Tabs (Levothyroxine sodium) .Marland Kitchen... Take one by mouth daily  Orders: TLB-TSH (Thyroid Stimulating Hormone) (84443-TSH)  Labs Reviewed: TSH: 3.04 (01/28/2009)    HgBA1c: 5.3 (07/08/2008)  Problem # 3:  FATIGUE (ICD-780.79) Assessment: Deteriorated likely multifactorial with dizziness/poor sleep/ poss dep/hypothyroid/anemia/age  lab today f/u 4-6 wk may need to investigate poss of depression anddisc tx for that  given alprazolam for insomnia to use with caution  Orders: T-Vitamin D (25-Hydroxy) ([address])  in conjunction with above- both acute and chronic improved this week-? if because of imp in mood  will f/u with cardiology as planned and   Problem # 4:  ANEMIA OF CHRONIC DISEASE (ICD-285.29) Assessment: Unchanged per pt stable  cbc today heme f/u as planned  Orders: Venipuncture (09811) TLB-BMP (Basic Metabolic Panel-BMET) (80048-METABOL) TLB-CBC Platelet - w/Differential (85025-CBCD) TLB-Hepatic/Liver Function Pnl (80076-HEPATIC)  TLB-TSH (Thyroid Stimulating Hormone) (84443-TSH) TLB-B12 + Folate Pnl (82746_82607-B12/FOL) T-Vitamin D (25-Hydroxy) ([address])  Problem # 5:  BACK PAIN, CHRONIC (ICD-724.5) Assessment: Deteriorated has deg joint dz and chronic pain from compression fx trial of vidocin low dose during day with great caution stressed imp not to mix with alprazolam pt does not want to return to clinic for back/ comp fx Her updated medication list for this problem includes:    Anacin 81 Mg Tbec (Aspirin) .Marland Kitchen... Take 1 tablet by mouth once a day    Tylenol Arthritis Pain 650 Mg Tbcr (Acetaminophen) .Marland Kitchen... Take 2 by mouth am and 2 by mouth pm    Vicodin 5-500 Mg Tabs (Hydrocodone-acetaminophen) .Marland Kitchen... 1/2 by mouth up to every 6 hours during the day as needed pain watch for sedation  Complete Medication  List: 1)  Anacin 81 Mg Tbec (Aspirin) .... Take 1 tablet by mouth once a day 2)  Daily Multiple Vitamins Tabs (Multiple vitamin) .... Take 1 tablet by mouth once a day 3)  Tylenol Arthritis Pain 650 Mg Tbcr (Acetaminophen) .... Take 2 by mouth am and 2 by mouth pm 4)  Boniva 2.5 Mg Tabs (Ibandronate sodium) .... Take one by mouth monthly 5)  Vit D 400 Mg  .... Take one by mouth daily 6)  Levothyroxine Sodium 25 Mcg Tabs (Levothyroxine sodium) .... Take one by mouth daily 7)  Prilosec 20 Mg Cpdr (Omeprazole) .Marland Kitchen.. 1 by mouth once daily in am 8)  Calcium With Vitamin D  9)  Alprazolam 0.5 Mg Tabs (Alprazolam) .Marland Kitchen.. 1 by mouth at bedtime as needed insomnia 10)  Vicodin 5-500 Mg Tabs (Hydrocodone-acetaminophen) .... 1/2 by mouth up to every 6 hours during the day as needed pain watch for sedation  Patient Instructions: 1)  try alprazolam for sleep with great caution - can make you more dizzy so use your walker  2)  try vicodin for pain during the day - also be very careful of falls  3)  this can also cause constipation- use stool softener if needed 4)  labs today for fatigue 5)  we will refer you to neurology for dizziness 6)  do not mix the pain med and the alprazolam (that means no pain medicine at night)  7)  follow up with me in 4-6 weeks  Prescriptions: VICODIN 5-500 MG TABS (HYDROCODONE-ACETAMINOPHEN) 1/2 by mouth up to every 6 hours during the day as needed pain watch for sedation  #30 x 0   Entered and Authorized by:   Judith Part MD   Signed by:   Judith Part MD on 06/11/2009   Method used:   Print then Give to Patient   RxID:   [id number] ALPRAZOLAM 0.5 MG TABS (ALPRAZOLAM) 1 by mouth at bedtime as needed insomnia  #30 x 3   Entered and Authorized by:   Judith Part MD   Signed by:   Judith Part MD on 06/11/2009   Method used:   Print then Give to Patient   RxID:   [id number]   Current Allergies (reviewed today): ! MIACALCIN ! * HRT ! EVISTA !  BONIVA

## 2010-04-14 NOTE — Miscellaneous (Signed)
Summary: Controlled Substances Contract  Controlled Substances Contract   Imported By: Lester  08/30/2009 09:34:11  _____________________________________________________________________  External Attachment:    Type:   Image     Comment:   External Document

## 2010-04-14 NOTE — Progress Notes (Signed)
Summary: Transfer  PCP  approval???  Phone Note Call from Patient   Caller: Patient Call For: Tiffany Part MD Summary of Call: Daughter would like to changed PCP from Dr. Milinda Antis to Dr. Dayton Martes.  Need approval from both physician.Tiffany Austin  Aug 06, 2009 1:53 PM  Initial call taken by: Tiffany Austin,  Aug 06, 2009 1:53 PM  Follow-up for Phone Call        Will await Dr. Royden Purl input. Ruthe Mannan MD  Aug 06, 2009 1:54 PM  that is fine with me- she is nice lady -- should not give you any problems - you can rev her problem list  Follow-up by: Tiffany Part MD,  Aug 06, 2009 2:08 PM  Additional Follow-up for Phone Call Additional follow up Details #1::        ok with me. Ruthe Mannan MD  Aug 06, 2009 2:25 PM     Additional Follow-up for Phone Call Additional follow up Details #2::    ok. I will be calling daughter to schedule appt.Tiffany Austin  Aug 07, 2009 1:11 PM  Follow-up by: Tiffany Austin,  Aug 07, 2009 1:11 PM

## 2010-04-14 NOTE — Letter (Signed)
Summary: [facility]  [facility]   Imported By: Maryln Gottron 02/09/2010 14:04:38  _____________________________________________________________________  External Attachment:    Type:   Image     Comment:   External Document

## 2010-04-14 NOTE — Assessment & Plan Note (Signed)
Summary: Flu vaccination   Nurse Visit   Allergies: 1)  ! Miacalcin 2)  ! * Hrt 3)  ! Evista 4)  ! Boniva 5)  * Alprazolam  Orders Added: 1)  Flu Vaccine 73yrs + MEDICARE PATIENTS [Q2039] 2)  Administration Flu vaccine - MCR [G0008]  Flu Vaccine Consent Questions     Do you have a history of severe allergic reactions to this vaccine? no    Any prior history of allergic reactions to egg and/or gelatin? no    Do you have a sensitivity to the preservative Thimersol? no    Do you have a past history of Guillan-Barre Syndrome? no    Do you currently have an acute febrile illness? no    Have you ever had a severe reaction to latex? no    Vaccine information given and explained to patient? yes    Are you currently pregnant? no    Lot Number:[id number]   Exp Date:09/12/2010   Site Given  Left Deltoid IM

## 2010-04-14 NOTE — Assessment & Plan Note (Signed)
Summary: DISCUSS MEDICATIONS/CLE   Vital Signs:  Patient profile:   77 year old female Weight:      111 pounds BMI:     21.05 Temp:     97.8 degrees F oral Pulse rate:   76 / minute Pulse rhythm:   regular BP sitting:   92 / 60  (left arm) Cuff size:   regular  Vitals Entered By: Lowella Petties CMA (April 09, 2009 3:27 PM) CC: Discuss meds   History of Present Illness: wants to know what medicines she is supposed to be taking   ran out of her thyroid medicine  is tired and also dry skin -- needs refil   still taking boniva and needs refil on that   had a uti back in december - got better - no burning or odor in urine   had a bout of nausea and vomiting bile this weekend (not rel to her boniva) that got better -  in past got nauseated with her iron   is taking her prilosec -- would like a generic   also pt mentions on the way out that she is having bladder leakage problems  had bladder tach in past- is worn out    Allergies: 1)  ! Miacalcin 2)  ! * Hrt 3)  ! Evista 4)  ! Boniva  Past History:  Past Medical History: Last updated: 07/08/2008 GERD Osteoporosis Peptic ulcer disease anemia of chronic dz and iron deficiency symptoms of periph neuropathy varicose veins   Past Surgical History: Last updated: 02/03/2008 Tonsillectomy Hysterectomy- fibroids, ovaries intract- age 7 Bladder repair with hyst Appendectomy Cataract extraction- implant Dexa- slightly improved osteopenia (06/2000) Compound fracture spine- T9,L1 (07/2000) L2,L3 compound fracture in past Cardiolite- neg EF 65% (08/2001) Dexa- OP at femeral neck (01/2003) Colonoscopy- divertics, hems (02/2004) Dexa- osteopenia at femoral neck (11/206) Foot surgery (2007) Blood transfusion (10/2005) EGD- gastric ulcer (11/2005) EGD-schatzki ring, HH, mucosal abnormalities, bx- H pylori (02/2006) EGD- schatzki ring, HH (11/09)  Family History: Last updated: May 21, 2007 Father: died of  stroke Mother: died age 56 Siblings: 3 brothers- all died of MI's, 2 sisters GF with colon cancer  Social History: Last updated: 05/21/07 Marital Status: widowed Children: 2 Occupation: retired  Risk Factors: Smoking Status: quit (05-21-2007)  Review of Systems General:  Complains of fatigue; denies chills, fever, loss of appetite, and malaise. Eyes:  Denies blurring and eye pain. CV:  Denies chest pain or discomfort, lightheadness, and palpitations. Resp:  Denies cough, shortness of breath, and wheezing. GI:  Denies abdominal pain, bloody stools, and change in bowel habits. GU:  Complains of incontinence and urinary frequency; denies dysuria, hematuria, and urinary hesitancy. MS:  Complains of joint pain, low back pain, mid back pain, and stiffness; denies cramps and muscle weakness. Derm:  Denies lesion(s), poor wound healing, and rash. Neuro:  Denies numbness and tingling. Psych:  Complains of depression; denies panic attacks, sense of great danger, and suicidal thoughts/plans. Endo:  Denies cold intolerance, excessive thirst, excessive urination, and heat intolerance. Heme:  Denies abnormal bruising and bleeding.  Physical Exam  General:  frail appearing elderly female in no distress  using walker today Head:  normocephalic, atraumatic, and no abnormalities observed.   Eyes:  vision grossly intact, pupils equal, pupils round, and pupils reactive to light.  no conjunctival pallor, injection or icterus  Mouth:  pharynx pink and moist.   Neck:  supple with full rom and no masses or thyromegally, no JVD or carotid bruit  Chest Wall:  No deformities, masses, or tenderness noted. Lungs:  CTA with distant bs at bases  no wheeze  Heart:  Normal rate and regular rhythm. S1 and S2 normal without gallop, murmur, click, rub or other extra sounds. Abdomen:  soft, non-tender, normal bowel sounds, no distention, no hepatomegaly, and no splenomegaly.   Msk:  kyphosis noted no new  joint changes poor rom hips / knees requires assistance for ambulation Pulses:  R and L carotid,radial,femoral,dorsalis pedis and posterior tibial pulses are full and equal bilaterally Extremities:  No clubbing, cyanosis, edema, or deformity noted with normal full range of motion of all joints.   Neurologic:  sensation intact to light touch and DTRs symmetrical and normal.  gait is labored with assistance Skin:  Intact without suspicious lesions or rashes Cervical Nodes:  No lymphadenopathy noted Psych:  normal affect, talkative and pleasant     Impression & Recommendations:  Problem # 1:  WEAKNESS (ICD-780.79) Assessment New weakness after pelvic fx- was originally unable to finish her PT due to other med problems  will ref to continue PT for strength and conditioning  Orders: Physical Therapy Referral (PT)  Problem # 2:  HYPOTHYROIDISM (ICD-244.9) Assessment: Unchanged  refil her thyroid supplement- does make her feel better rev last labs f/u in 6 mo  Her updated medication list for this problem includes:    Levothyroxine Sodium 25 Mcg Tabs (Levothyroxine sodium) .Marland Kitchen... Take one by mouth daily  Labs Reviewed: TSH: 3.04 (01/28/2009)    HgBA1c: 5.3 (07/08/2008)  Problem # 3:  DEPRESSION (ICD-311) Assessment: Unchanged disc poss that depression aff her energy level she is not interested in counseling at this time The following medications were removed from the medication list:    Lorazepam 0.5 Mg Tabs (Lorazepam) .Marland Kitchen... Take one by mouth at bedtime as needed  Problem # 4:  OSTEOPOROSIS (ICD-733.00) Assessment: Unchanged plan to continue boniva will continue to watch for side eff  f/u 6 mo  Her updated medication list for this problem includes:    Boniva 2.5 Mg Tabs (Ibandronate sodium) .Marland Kitchen... Take one by mouth monthly  Problem # 5:  INCONTINENCE (ICD-788.30) Assessment: New  disc briefly today suspect comb of urge and stress ua is pos for wbc and rbc-- will culture  and advise further  Orders: UA Dipstick W/ Micro (manual) (57846)  Complete Medication List: 1)  Anacin 81 Mg Tbec (Aspirin) .... Take 1 tablet by mouth once a day 2)  Daily Multiple Vitamins Tabs (Multiple vitamin) .... Take 1 tablet by mouth once a day 3)  Tylenol Arthritis Pain 650 Mg Tbcr (Acetaminophen) .... Take 2 by mouth am and 2 by mouth pm 4)  Boniva 2.5 Mg Tabs (Ibandronate sodium) .... Take one by mouth monthly 5)  Vit D 400 Mg  .... Take one by mouth daily 6)  Levothyroxine Sodium 25 Mcg Tabs (Levothyroxine sodium) .... Take one by mouth daily 7)  Prilosec 20 Mg Cpdr (Omeprazole) .Marland Kitchen.. 1 by mouth once daily in am 8)  Calcium With Vitamin D   Other Orders: T-Culture, Urine ([id number]) Specimen Handling ([id number])  Patient Instructions: 1)  I sent px to your pharmacy  2)  we will do PT referral at check out  3)  no change in pharmacy  4)  gradually increase your activity as tolerated  5)  keep up with regular meals  6)  follow up with me in  6 months  Prescriptions: PRILOSEC 20 MG CPDR (OMEPRAZOLE) 1 by mouth once daily  in am  #90 x 3   Entered and Authorized by:   Judith Part MD   Signed by:   Judith Part MD on 04/09/2009   Method used:   [facility]        [facility]       [address]       [place], [place]  [address]       Ph: [phone] or [phone]       Fax: [phone]   RxID:   [id number] LEVOTHYROXINE SODIUM 25 MCG TABS (LEVOTHYROXINE SODIUM) take one by mouth daily  #90 x 3   Entered and Authorized by:   Judith Part MD   Signed by:   Judith Part MD on 04/09/2009   Method used:   [facility]        [facility] (retail)       [address]       [place], [place]  [address]       Ph: [phone] or [phone]       Fax: [phone]   RxID:   [id number] BONIVA 2.5 MG  TABS (IBANDRONATE SODIUM) take one by mouth monthly  #3 months x 3   Entered and Authorized by:    Judith Part MD   Signed by:   Judith Part MD on 04/09/2009   Method used:   [facility]        [id number]       [address]       [place], [place]  [address]       Ph: [phone] or [phone]       Fax: [phone]   RxID:   [id number]   Prior Medications (reviewed today): ANACIN 81 MG  TBEC (ASPIRIN) Take 1 tablet by mouth once a day DAILY MULTIPLE VITAMINS   TABS (MULTIPLE VITAMIN) Take 1 tablet by mouth once a day TYLENOL ARTHRITIS PAIN 650 MG  TBCR (ACETAMINOPHEN) take 2 by mouth am and 2 by mouth pm VIT D 400 MG () take one by mouth daily LEVOTHYROXINE SODIUM 25 MCG TABS (LEVOTHYROXINE SODIUM) take one by mouth daily PRILOSEC 20 MG CPDR (OMEPRAZOLE) 1 by mouth once daily in am CALCIUM WITH VITAMIN D ()  Current Allergies: ! MIACALCIN ! * HRT ! EVISTA ! [facility]   Laboratory Results   Urine Tests  Date/Time Received: April 09, 2009 4:13 PM  Date/Time Reported: April 09, 2009 4:13 PM   Routine Urinalysis   Color: yellow Appearance: Clear Glucose: negative   (Normal Range: Negative) Bilirubin: negative   (Normal Range: Negative) Ketone: negative   (Normal Range: Negative) Spec. Gravity: 1.010   (Normal Range: 1.003-1.035) Blood: moderate   (Normal Range: Negative) pH: 6.0   (Normal Range: 5.0-8.0) Protein: trace   (Normal Range: Negative) Urobilinogen: 0.2   (Normal Range: 0-1) Nitrite: negative   (Normal Range: Negative) Leukocyte Esterace: negative   (Normal Range: Negative)  Urine Microscopic WBC/HPF: 5-10 RBC/HPF: 5-10 Bacteria/HPF: many Mucous/HPF: many Epithelial/HPF: many Crystals/HPF: 0 Casts/LPF: 0 Yeast/HPF: 0 Other: 0    Comments: sent for cx     Laboratory Results   Urine Tests    Routine Urinalysis   Color: yellow Appearance: Clear Glucose: negative   (Normal Range: Negative) Bilirubin: negative   (Normal Range: Negative) Ketone: negative   (Normal Range: Negative) Spec.  Gravity: 1.010   (Normal Range: 1.003-1.035) Blood: moderate   (Normal Range:  Negative) pH: 6.0   (Normal Range: 5.0-8.0) Protein: trace   (Normal Range: Negative) Urobilinogen: 0.2   (Normal Range: 0-1) Nitrite: negative   (Normal Range: Negative) Leukocyte Esterace: negative   (Normal Range: Negative)  Urine Microscopic WBC/hpf: 5-10 RBC/hpf: 5-10 Bacteria: many Mucous: many Epithelial: many Crystals/LPF: 0 Casts/LPF: 0 Yeast/HPF: 0 Other: 0    Comments: sent for cx

## 2010-04-14 NOTE — Letter (Signed)
[facility]   Imported By: Lennie Odor 08/22/2009 15:37:27  _____________________________________________________________________  External Attachment:    Type:   Image     Comment:   External Document

## 2010-04-14 NOTE — Progress Notes (Signed)
Summary: refill request for remeron  Phone Note Refill Request Message from:  Fax from Pharmacy  Refills Requested: Medication #1:  MIRTAZAPINE 7.5 MG TABS 1 tab by mouth qhs.   Last Refilled: 12/24/2009 [facility] [address]  Initial call taken by: Lowella Petties CMA, AAMA,  February 10, 2010 3:20 PM    Prescriptions: MIRTAZAPINE 7.5 MG TABS (MIRTAZAPINE) 1 tab by mouth qhs  #30 x 1   Entered and Authorized by:   Kerby Nora MD   Signed by:   Kerby Nora MD on 02/10/2010   Method used:   [facility]        [facility] (retail)       [address]       [place], [place]  [address]       Ph: [phone] or [phone]       Fax: [phone]   RxID:   [id number]

## 2010-04-14 NOTE — Consult Note (Signed)
Summary: [facility]  [facility]   Imported By: Lester [place] 11/19/2009 12:55:35  _____________________________________________________________________  External Attachment:    Type:   Image     Comment:   External Document

## 2010-04-14 NOTE — Miscellaneous (Signed)
Summary: PT Orders/[facility]  [facility]   Imported By: Lanelle Bal 05/26/2009 10:43:12  _____________________________________________________________________  External Attachment:    Type:   Image     Comment:   External Document

## 2010-04-14 NOTE — Assessment & Plan Note (Signed)
Summary: ROA 30 MIN APPROVED PER DR Lacosta Hargan.. CYD   Vital Signs:  Patient profile:   77 year old female Height:      61 inches Weight:      105 pounds BMI:     19.91 Temp:     97.6 degrees F oral Pulse rate:   80 / minute Pulse rhythm:   regular BP sitting:   124 / 78  (right arm) Cuff size:   regular  Vitals Entered By: Linde Gillis CMA Duncan Dull) (August 20, 2009 3:12 PM) CC: 30 minute follow up visit   History of Present Illness: 77 yo here for  back pain.  Acute onset of new upper back pain.  Has osteoporosis, on Boniva 2.5 mg monthly. H/o previous thoracic compression fractures, T8, T 10, T11, T12.  Follow up xray two days ago showed new compression fracture at T5, other compression fractures unchanged.  No CP, SOB or other symptoms.  Current Medications (verified): 1)  Anacin 81 Mg  Tbec (Aspirin) .... Take 1 Tablet By Mouth Once A Day 2)  Daily Multiple Vitamins   Tabs (Multiple Vitamin) .... Take 1 Tablet By Mouth Once A Day 3)  Tylenol Arthritis Pain 650 Mg  Tbcr (Acetaminophen) .... Take 2 By Mouth Am and 2 By Mouth Pm 4)  Boniva 2.5 Mg  Tabs (Ibandronate Sodium) .... Take One By Mouth Monthly 5)  Vit D 400 Mg .... Take One By Mouth Daily 6)  Levothyroxine Sodium 25 Mcg Tabs (Levothyroxine Sodium) .... Take One By Mouth Daily 7)  Prilosec 20 Mg Cpdr (Omeprazole) .Marland Kitchen.. 1 By Mouth Once Daily in Am 8)  Calcium With Vitamin D 9)  Vicodin 5-500 Mg Tabs (Hydrocodone-Acetaminophen) .... 1/2 By Mouth Up To Every 6 Hours During The Day As Needed Pain Watch For Sedation 10)  Lorazepam 0.5 Mg Tabs (Lorazepam) .Marland Kitchen.. 1 By Mouth At Bedtime As Needed Insomnia 11)  Mirtazapine 7.5 Mg Tabs (Mirtazapine) .Marland Kitchen.. 1 Tab By Mouth Qhs  Allergies: 1)  ! Miacalcin 2)  ! * Hrt 3)  ! Evista 4)  ! Boniva 5)  * Alprazolam  Review of Systems      See HPI General:  Denies fatigue, fever, and malaise. Resp:  Denies chest pain with inspiration and shortness of breath. MS:  Complains of thoracic  pain; denies loss of strength.  Physical Exam  General:  frail appearing elderly female in no distress   Msk:  throacic vertebrae TTP throughout, most signficantly T4-T6 Extremities:  No clubbing, cyanosis, edema, or deformity noted with normal full range of motion of all joints.   Neurologic:  cranial nerves II-XII intact, strength normal in all extremities, sensation intact to light touch, DTRs symmetrical and normal  does need some assistance to rise from sitting Psych:  normally interactive, good eye contact, not anxious appearing, and not depressed appearing.     Impression & Recommendations:  Problem # 1:  VERTEBRAL FRACTURE, THORACIC SPINE (ICD-805.2) Assessment New  Time spent with patient 25 minutes, more than 50% of this time was spent counseling patient on her new vertebral fracture and worsening osteoporosis.  Stop Boniva, will start Prolia, ordered in house today. She would like referral to spine doctor to follow her fractures as her previous MD moved to [facility] Vicodin as needed pain.  Orders: Misc. Referral (Misc. Ref)  Complete Medication List: 1)  Anacin 81 Mg Tbec (Aspirin) .... Take 1 tablet by mouth once a day 2)  Daily Multiple Vitamins Tabs (  Multiple vitamin) .... Take 1 tablet by mouth once a day 3)  Tylenol Arthritis Pain 650 Mg Tbcr (Acetaminophen) .... Take 2 by mouth am and 2 by mouth pm 4)  Boniva 2.5 Mg Tabs (Ibandronate sodium) .... Take one by mouth monthly 5)  Vit D 400 Mg  .... Take one by mouth daily 6)  Levothyroxine Sodium 25 Mcg Tabs (Levothyroxine sodium) .... Take one by mouth daily 7)  Prilosec 20 Mg Cpdr (Omeprazole) .Marland Kitchen.. 1 by mouth once daily in am 8)  Calcium With Vitamin D  9)  Vicodin 5-500 Mg Tabs (Hydrocodone-acetaminophen) .... 1/2 by mouth up to every 6 hours during the day as needed pain watch for sedation 10)  Lorazepam 0.5 Mg Tabs (Lorazepam) .Marland Kitchen.. 1 by mouth at bedtime as needed insomnia 11)  Mirtazapine 7.5 Mg Tabs  (Mirtazapine) .Marland Kitchen.. 1 tab by mouth qhs  Patient Instructions: 1)  Please stop by to see Shirlee Limerick on your way out to set up your your brain and spine referral. Prescriptions: VICODIN 5-500 MG TABS (HYDROCODONE-ACETAMINOPHEN) 1/2 by mouth up to every 6 hours during the day as needed pain watch for sedation  #60 x 0   Entered and Authorized by:   Ruthe Mannan MD   Signed by:   Ruthe Mannan MD on 08/20/2009   Method used:   Print then Give to Patient   RxID:   [id number]   Current Allergies (reviewed today): ! MIACALCIN ! * HRT ! EVISTA ! BONIVA * ALPRAZOLAM

## 2010-04-14 NOTE — Progress Notes (Signed)
Summary: needs order for urine cx  Phone Note Call from Patient   Summary of Call: Pt has dropped off a urine for culture, please print order. Initial call taken by: Lowella Petties CMA,  April 14, 2009 2:41 PM  Follow-up for Phone Call        done- thanks Follow-up by: Judith Part MD,  April 14, 2009 4:05 PM     Appended Document: needs order for urine cx

## 2010-04-14 NOTE — Progress Notes (Signed)
Summary: Rx Mirtazapine  Phone Note Refill Request Call back at [phone] Message [facility]  [facility] December 23, 2009 1:03 PM  Refills Requested: Medication #1:  MIRTAZAPINE 7.5 MG TABS 1 tab by mouth qhs. Received E-script request please advise.   Method Requested: Telephone to Pharmacy Initial call taken by: Linde Gillis CMA Duncan Dull),  December 23, 2009 1:03 PM    Prescriptions: MIRTAZAPINE 7.5 MG TABS (MIRTAZAPINE) 1 tab by mouth qhs  #30 x 0   Entered and Authorized by:   Ruthe Mannan MD   Signed by:   Ruthe Mannan MD on 12/24/2009   Method used:   [facility]ron[facility]        [facility]  [place] (retail)       [address]       [place], [place]  [address]       Ph: [phone] or [phone]       Fax: [phone]   RxID:   [id number]

## 2010-04-14 NOTE — Letter (Signed)
[facility]   Imported By: Lanelle Bal 08/14/2009 12:30:50  _____________________________________________________________________  External Attachment:    Type:   Image     Comment:   External Document

## 2010-04-14 NOTE — Letter (Signed)
[facility]   Imported By: Lanelle Bal 07/24/2009 14:09:25  _____________________________________________________________________  External Attachment:    Type:   Image     Comment:   External Document

## 2010-04-14 NOTE — Progress Notes (Signed)
Summary: Rx Hydrocodone-APAP  Phone Note Refill Request Call back at [phone] Message [facility]:  [facility] on November 21, 2009 1:51 PM  Refills Requested: Medication #1:  VICODIN 5-500 MG TABS 1/2 by mouth up to every 6 hours during the day as needed pain watch for sedation   Last Refilled: 08/21/2009 Received faxed refill request please advise.   Method Requested: Telephone to Pharmacy Initial call taken by: Linde Gillis CMA Duncan Dull),  November 21, 2009 1:53 PM  Follow-up for Phone Call        Rx called to pharmacy Follow-up by: Linde Gillis CMA Duncan Dull),  November 21, 2009 2:41 PM    Prescriptions: VICODIN 5-500 MG TABS (HYDROCODONE-ACETAMINOPHEN) 1/2 by mouth up to every 6 hours during the day as needed pain watch for sedation  #60 x 0   Entered and Authorized by:   Ruthe Mannan MD   Signed by:   Ruthe Mannan MD on 11/21/2009   Method used:   Telephoned to ...       [facility]  [place] (retail)       [address]       [place], [place]  [address]       Ph: [phone] or [phone]       Fax: [phone]   RxID:   [id number]

## 2010-04-14 NOTE — Letter (Signed)
[facility]   Imported By: Maryln Gottron 10/28/2009 12:54:36  _____________________________________________________________________  External Attachment:    Type:   Image     Comment:   External Document

## 2010-04-14 NOTE — Miscellaneous (Signed)
Summary: [facility] Order/[facility]  [facility] Order/[facility]   Imported By: Lanelle Bal 06/30/2009 13:59:42  _____________________________________________________________________  External Attachment:    Type:   Image     Comment:   External Document

## 2010-04-16 NOTE — Progress Notes (Signed)
Summary: vicodin  Phone Note Refill Request Message from:  Fax from Pharmacy on March 02, 2010 10:05 AM  Refills Requested: Medication #1:  VICODIN 5-500 MG TABS 1/2 by mouth up to every 6 hours during the day as needed pain watch for sedation   Last Refilled: 11/21/2009 [phone].   Initial call taken by: Melody Comas,  March 02, 2010 10:06 AM  Follow-up for Phone Call        refilled.  please phone to pharmacy Follow-up by: Eustaquio Boyden  MD,  March 02, 2010 11:06 AM  Additional Follow-up for Phone Call Additional follow up Details #1::        Rx called to pharmacy Additional Follow-up by: Linde Gillis CMA Duncan Dull),  March 02, 2010 11:11 AM    Prescriptions: VICODIN 5-500 MG TABS (HYDROCODONE-ACETAMINOPHEN) 1/2 by mouth up to every 6 hours during the day as needed pain watch for sedation  #60 x 0   Entered and Authorized by:   Eustaquio Boyden  MD   Signed by:   Eustaquio Boyden  MD on 03/02/2010   Method used:   Telephoned to ...       [facility]  [place] ([facility]ail)       [address]       [place], [place]  [address]       Ph: [phone] or [phone]       Fax: [phone]   RxID:   [id number]

## 2010-04-16 NOTE — Letter (Signed)
Summary: [facility]  [facility]   Imported By: Lanelle Bal 03/30/2010 08:05:36  _____________________________________________________________________  External Attachment:    Type:   Image     Comment:   External Document

## 2010-04-16 NOTE — Letter (Signed)
Summary: [facility]  [facility]   Imported By: Lanelle Bal 02/26/2010 11:59:53  _____________________________________________________________________  External Attachment:    Type:   Image     Comment:   External Document

## 2010-04-21 ENCOUNTER — Encounter (HOSPITAL_BASED_OUTPATIENT_CLINIC_OR_DEPARTMENT_OTHER): Payer: MEDICARE | Admitting: Oncology

## 2010-04-21 ENCOUNTER — Other Ambulatory Visit: Payer: Self-pay | Admitting: Oncology

## 2010-04-21 DIAGNOSIS — D500 Iron deficiency anemia secondary to blood loss (chronic): Secondary | ICD-10-CM

## 2010-04-21 LAB — CBC WITH DIFFERENTIAL/PLATELET
BASO%: 0.4 % (ref 0.0–2.0)
LYMPH%: 14.3 % (ref 14.0–49.7)
MCHC: 32.5 g/dL (ref 31.5–36.0)
MCV: 87.3 fL (ref 79.5–101.0)
MONO%: 10.6 % (ref 0.0–14.0)
NEUT#: 5.2 10*3/uL (ref 1.5–6.5)
Platelets: 212 10*3/uL (ref 145–400)
RBC: 4.78 10*6/uL (ref 3.70–5.45)
RDW: 22.6 % — ABNORMAL HIGH (ref 11.2–14.5)
WBC: 7.1 10*3/uL (ref 3.9–10.3)

## 2010-04-24 ENCOUNTER — Ambulatory Visit (INDEPENDENT_AMBULATORY_CARE_PROVIDER_SITE_OTHER): Payer: 59

## 2010-04-24 ENCOUNTER — Encounter (INDEPENDENT_AMBULATORY_CARE_PROVIDER_SITE_OTHER): Payer: Self-pay | Admitting: *Deleted

## 2010-04-24 DIAGNOSIS — M810 Age-related osteoporosis without current pathological fracture: Secondary | ICD-10-CM

## 2010-04-27 ENCOUNTER — Telehealth: Payer: Self-pay | Admitting: Family Medicine

## 2010-04-30 NOTE — Assessment & Plan Note (Signed)
Summary: prolio/alc   Nurse Visit   Allergies: 1)  ! Miacalcin 2)  ! * Hrt 3)  ! Evista 4)  ! Boniva 5)  * Alprazolam  Medication Administration  Injection # 1:    Medication: Prolia 60mg     Diagnosis: OSTEOPOROSIS (ICD-733.00)    Route: SQ    Site: L deltoid    Exp Date: 03/16/2011    Lot #: [id number]    Mfr: amgen    Patient tolerated injection without complications    Given by: Melody Comas (April 24, 2010 11:20 AM)  Appended Document: prolio/alc     Clinical Lists Changes  Orders: Added new Service order of Admin of Therapeutic Inj  intramuscular or subcutaneous ([id number]) - Signed       Orders Added: 1)  Admin of Therapeutic Inj  intramuscular or subcutaneous [[id number]]

## 2010-05-06 NOTE — Progress Notes (Signed)
Summary: Rx Mirtazapine  Phone Note Refill Request Call back at [phone] Message from:  [facility] on April 27, 2010 2:24 PM  Refills Requested: Medication #1:  MIRTAZAPINE 7.5 MG TABS 1 tab by mouth qhs.   Last Refilled: 03/30/2010 Received E-script request please advise.   Method Requested: Telephone to Pharmacy Initial call taken by: Linde Gillis CMA Duncan Dull),  April 27, 2010 2:25 PM    Prescriptions: MIRTAZAPINE 7.5 MG TABS (MIRTAZAPINE) 1 tab by mouth qhs  #30 x 1   Entered and Authorized by:   Ruthe Mannan MD   Signed by:   Ruthe Mannan MD on 04/27/2010   Method used:   Electronically to        [facility] (retail)       [address]       [place], [place]  [address]       Ph: [phone] or [phone]       Fax: [phone]   RxID:   [id number]

## 2010-05-12 ENCOUNTER — Other Ambulatory Visit: Payer: Self-pay | Admitting: Oncology

## 2010-05-12 ENCOUNTER — Encounter (HOSPITAL_BASED_OUTPATIENT_CLINIC_OR_DEPARTMENT_OTHER): Payer: 59 | Admitting: Oncology

## 2010-05-12 DIAGNOSIS — D500 Iron deficiency anemia secondary to blood loss (chronic): Secondary | ICD-10-CM

## 2010-05-12 LAB — CBC WITH DIFFERENTIAL/PLATELET
Eosinophils Absolute: 0.1 10*3/uL (ref 0.0–0.5)
LYMPH%: 20.1 % (ref 14.0–49.7)
MCV: 89.1 fL (ref 79.5–101.0)
MONO%: 11.5 % (ref 0.0–14.0)
NEUT#: 3.9 10*3/uL (ref 1.5–6.5)
Platelets: 177 10*3/uL (ref 145–400)
RBC: 4.19 10*6/uL (ref 3.70–5.45)

## 2010-05-26 LAB — CROSSMATCH
ABO/RH(D): A POS
Unit division: 0
Unit division: 0

## 2010-06-02 ENCOUNTER — Encounter (HOSPITAL_BASED_OUTPATIENT_CLINIC_OR_DEPARTMENT_OTHER): Payer: 59 | Admitting: Oncology

## 2010-06-02 ENCOUNTER — Other Ambulatory Visit: Payer: Self-pay | Admitting: Oncology

## 2010-06-02 DIAGNOSIS — M810 Age-related osteoporosis without current pathological fracture: Secondary | ICD-10-CM

## 2010-06-02 DIAGNOSIS — D509 Iron deficiency anemia, unspecified: Secondary | ICD-10-CM

## 2010-06-02 DIAGNOSIS — D638 Anemia in other chronic diseases classified elsewhere: Secondary | ICD-10-CM

## 2010-06-02 LAB — CBC & DIFF AND RETIC
Basophils Absolute: 0 10*3/uL (ref 0.0–0.1)
Eosinophils Absolute: 0.1 10*3/uL (ref 0.0–0.5)
HCT: 38.2 % (ref 34.8–46.6)
HGB: 12.4 g/dL (ref 11.6–15.9)
Immature Retic Fract: 3.5 % (ref 0.00–10.70)
LYMPH%: 20.3 % (ref 14.0–49.7)
MONO#: 0.7 10*3/uL (ref 0.1–0.9)
NEUT#: 4.4 10*3/uL (ref 1.5–6.5)
NEUT%: 67.2 % (ref 38.4–76.8)
Platelets: 190 10*3/uL (ref 145–400)
WBC: 6.5 10*3/uL (ref 3.9–10.3)

## 2010-06-02 LAB — CROSSMATCH: Antibody Screen: NEGATIVE

## 2010-06-02 LAB — IRON AND TIBC
%SAT: 39 % (ref 20–55)
TIBC: 253 ug/dL (ref 250–470)

## 2010-06-02 LAB — FERRITIN: Ferritin: 499 ng/mL — ABNORMAL HIGH (ref 10–291)

## 2010-06-05 ENCOUNTER — Telehealth: Payer: Self-pay | Admitting: *Deleted

## 2010-06-05 MED ORDER — HYDROCODONE-ACETAMINOPHEN 5-500 MG PO CAPS: ORAL_CAPSULE | ORAL | Status: DC

## 2010-06-05 NOTE — Telephone Encounter (Signed)
Rx called in as directed.   

## 2010-06-05 NOTE — Telephone Encounter (Signed)
Ok to fill.  Please call in and notify patient.

## 2010-06-06 MED ORDER — HYDROCODONE-ACETAMINOPHEN 5-500 MG PO CAPS: ORAL_CAPSULE | ORAL | Status: DC

## 2010-06-06 NOTE — Telephone Encounter (Signed)
Signed note.  Let pt know.  Thanks.

## 2010-06-09 ENCOUNTER — Emergency Department (HOSPITAL_COMMUNITY): Payer: Medicare Other

## 2010-06-09 ENCOUNTER — Emergency Department (HOSPITAL_COMMUNITY)
Admission: EM | Admit: 2010-06-09 | Discharge: 2010-06-09 | Disposition: A | Discharge status: Home / Self Care | Payer: Medicare Other | Attending: Emergency Medicine | Admitting: Emergency Medicine

## 2010-06-09 ENCOUNTER — Telehealth: Payer: Self-pay | Admitting: *Deleted

## 2010-06-09 DIAGNOSIS — M545 Low back pain, unspecified: Secondary | ICD-10-CM | POA: Insufficient documentation

## 2010-06-09 DIAGNOSIS — M1990 Unspecified osteoarthritis, unspecified site: Secondary | ICD-10-CM | POA: Insufficient documentation

## 2010-06-09 DIAGNOSIS — R32 Unspecified urinary incontinence: Secondary | ICD-10-CM | POA: Insufficient documentation

## 2010-06-09 DIAGNOSIS — R079 Chest pain, unspecified: Secondary | ICD-10-CM | POA: Insufficient documentation

## 2010-06-09 DIAGNOSIS — L989 Disorder of the skin and subcutaneous tissue, unspecified: Secondary | ICD-10-CM | POA: Insufficient documentation

## 2010-06-09 DIAGNOSIS — N390 Urinary tract infection, site not specified: Secondary | ICD-10-CM | POA: Insufficient documentation

## 2010-06-09 DIAGNOSIS — R64 Cachexia: Secondary | ICD-10-CM | POA: Insufficient documentation

## 2010-06-09 DIAGNOSIS — Z79899 Other long term (current) drug therapy: Secondary | ICD-10-CM | POA: Insufficient documentation

## 2010-06-09 LAB — URINALYSIS, ROUTINE W REFLEX MICROSCOPIC
Bilirubin Urine: NEGATIVE
Glucose, UA: NEGATIVE mg/dL
Nitrite: NEGATIVE
Specific Gravity, Urine: 1.019 (ref 1.005–1.030)
pH: 5.5 (ref 5.0–8.0)

## 2010-06-09 LAB — BASIC METABOLIC PANEL
BUN: 19 mg/dL (ref 6–23)
CO2: 27 mEq/L (ref 19–32)
Chloride: 106 mEq/L (ref 96–112)
GFR calc non Af Amer: >60 mL/min (ref >60)
Glucose, Bld: 104 mg/dL — ABNORMAL HIGH (ref 70–99)
Potassium: 4.2 mEq/L (ref 3.5–5.1)
Sodium: 138 mEq/L (ref 135–145)

## 2010-06-09 LAB — CBC
HCT: 39.1 % (ref 36.0–46.0)
Hemoglobin: 12.9 g/dL (ref 12.0–15.0)
MCV: 92 fL (ref 78.0–100.0)
RBC: 4.25 MIL/uL (ref 3.87–5.11)
RDW: 15.6 % — ABNORMAL HIGH (ref 11.5–15.5)
WBC: 8.9 10*3/uL (ref 4.0–10.5)

## 2010-06-09 LAB — URINE MICROSCOPIC-ADD ON

## 2010-06-09 LAB — DIFFERENTIAL
Basophils Absolute: 0 10*3/uL (ref 0.0–0.1)
Lymphocytes Relative: 16 % (ref 12–46)
Lymphs Abs: 1.4 10*3/uL (ref 0.7–4.0)
Neutro Abs: 6.4 10*3/uL (ref 1.7–7.7)

## 2010-06-09 NOTE — Telephone Encounter (Signed)
Agree, please advise to go to ER.

## 2010-06-09 NOTE — Telephone Encounter (Signed)
Pt's daughter called and reported that patient has been in extreme back pain since early morning.  She thinks she has another fracture.  She asked if she should call 911 and have pt transported to ER.  Advised her that if pt thinks she has a back fracture, yes.

## 2010-06-09 NOTE — Telephone Encounter (Signed)
Pt was going to ER via ambulance.

## 2010-06-12 ENCOUNTER — Telehealth: Payer: Self-pay | Admitting: *Deleted

## 2010-06-12 NOTE — Telephone Encounter (Signed)
I would look into local assisted living facilities.  The administrators at those facilities can help with insurance coverage and information.

## 2010-06-12 NOTE — Telephone Encounter (Signed)
Pt's daughter called.  Pt did go to ER on 3/27 for back pain.  She didn't have any more breaks but was found to have a UTI.  She was given antibiotics for 10 days.  Daughter states pt cant do anything for herself- cant bathe herself, cant fix her own food, cant do anything.  Daughter states she cant care for her, she has her own back problems.  She is at a loss as to what to do, thinks pt needs to be in rehab.  Please advise with any suggestions you have.

## 2010-06-12 NOTE — Telephone Encounter (Signed)
Daughter called back and they can get Tiffany Austin into [facility].  They need: 1) an order for admission from Dr. Dayton Martes, 2) copy of current history, and 3) FL2 form that daughter will be bringing by for Dr. Dayton Martes to fill out.  Fax to [employer] at [phone].

## 2010-06-12 NOTE — Telephone Encounter (Signed)
Spoke with daughter and advised her as instructed.

## 2010-06-13 ENCOUNTER — Inpatient Hospital Stay (HOSPITAL_COMMUNITY): Payer: Medicare Other

## 2010-06-13 ENCOUNTER — Inpatient Hospital Stay (HOSPITAL_COMMUNITY)
Admission: EM | Admit: 2010-06-13 | Discharge: 2010-06-19 | DRG: 516 | Disposition: A | Discharge status: Skilled Nursing Facility | Payer: Medicare Other | Attending: Internal Medicine | Admitting: Internal Medicine

## 2010-06-13 ENCOUNTER — Emergency Department (HOSPITAL_COMMUNITY): Payer: Medicare Other

## 2010-06-13 DIAGNOSIS — R279 Unspecified lack of coordination: Secondary | ICD-10-CM | POA: Diagnosis present

## 2010-06-13 DIAGNOSIS — K219 Gastro-esophageal reflux disease without esophagitis: Secondary | ICD-10-CM | POA: Diagnosis present

## 2010-06-13 DIAGNOSIS — Y849 Medical procedure, unspecified as the cause of abnormal reaction of the patient, or of later complication, without mention of misadventure at the time of the procedure: Secondary | ICD-10-CM | POA: Diagnosis present

## 2010-06-13 DIAGNOSIS — T8189XA Other complications of procedures, not elsewhere classified, initial encounter: Secondary | ICD-10-CM | POA: Diagnosis present

## 2010-06-13 DIAGNOSIS — M810 Age-related osteoporosis without current pathological fracture: Secondary | ICD-10-CM | POA: Diagnosis present

## 2010-06-13 DIAGNOSIS — R262 Difficulty in walking, not elsewhere classified: Secondary | ICD-10-CM | POA: Diagnosis present

## 2010-06-13 DIAGNOSIS — M8448XA Pathological fracture, other site, initial encounter for fracture: Principal | ICD-10-CM | POA: Diagnosis present

## 2010-06-13 DIAGNOSIS — K5900 Constipation, unspecified: Secondary | ICD-10-CM | POA: Diagnosis present

## 2010-06-13 DIAGNOSIS — I447 Left bundle-branch block, unspecified: Secondary | ICD-10-CM | POA: Diagnosis present

## 2010-06-13 DIAGNOSIS — I359 Nonrheumatic aortic valve disorder, unspecified: Secondary | ICD-10-CM | POA: Diagnosis present

## 2010-06-13 DIAGNOSIS — R4182 Altered mental status, unspecified: Secondary | ICD-10-CM | POA: Diagnosis present

## 2010-06-13 DIAGNOSIS — N390 Urinary tract infection, site not specified: Secondary | ICD-10-CM | POA: Diagnosis present

## 2010-06-13 DIAGNOSIS — E860 Dehydration: Secondary | ICD-10-CM | POA: Diagnosis present

## 2010-06-13 DIAGNOSIS — Z66 Do not resuscitate: Secondary | ICD-10-CM | POA: Diagnosis present

## 2010-06-13 DIAGNOSIS — G8929 Other chronic pain: Secondary | ICD-10-CM | POA: Diagnosis present

## 2010-06-13 LAB — CARDIAC PANEL(CRET KIN+CKTOT+MB+TROPI): Relative Index: 1.7 (ref 0.0–2.5)

## 2010-06-13 LAB — POCT I-STAT, CHEM 8
Creatinine, Ser: 1 mg/dL (ref 0.4–1.2)
Glucose, Bld: 105 mg/dL — ABNORMAL HIGH (ref 70–99)
HCT: 36 % (ref 36.0–46.0)
Hemoglobin: 12.2 g/dL (ref 12.0–15.0)
Sodium: 133 mEq/L — ABNORMAL LOW (ref 135–145)
TCO2: 25 mmol/L (ref 0–100)

## 2010-06-13 LAB — DIFFERENTIAL
Basophils Absolute: 0 10*3/uL (ref 0.0–0.1)
Basophils Relative: 0 % (ref 0–1)
Eosinophils Relative: 0 % (ref 0–5)
Monocytes Absolute: 1.6 10*3/uL — ABNORMAL HIGH (ref 0.1–1.0)
Neutro Abs: 10.2 10*3/uL — ABNORMAL HIGH (ref 1.7–7.7)

## 2010-06-13 LAB — URINALYSIS, ROUTINE W REFLEX MICROSCOPIC
Leukocytes, UA: NEGATIVE
Nitrite: NEGATIVE
Protein, ur: NEGATIVE mg/dL
Urobilinogen, UA: 0.2 mg/dL (ref 0.0–1.0)

## 2010-06-13 LAB — CBC
MCHC: 33.6 g/dL (ref 30.0–36.0)
RDW: 13.9 % (ref 11.5–15.5)

## 2010-06-13 LAB — URINE MICROSCOPIC-ADD ON

## 2010-06-14 LAB — CARDIAC PANEL(CRET KIN+CKTOT+MB+TROPI)
CK, MB: 5.2 ng/mL — ABNORMAL HIGH (ref 0.3–4.0)
CK, MB: 3.9 ng/mL (ref 0.3–4.0)
Relative Index: 1.9 (ref 0.0–2.5)
Troponin I: 0.06 ng/mL (ref 0.00–0.06)

## 2010-06-14 LAB — BASIC METABOLIC PANEL
Calcium: 7.4 mg/dL — ABNORMAL LOW (ref 8.4–10.5)
GFR calc non Af Amer: >60 mL/min (ref >60)
Glucose, Bld: 96 mg/dL (ref 70–99)
Sodium: 135 mEq/L (ref 135–145)

## 2010-06-14 LAB — CBC
HCT: 31.5 % — ABNORMAL LOW (ref 36.0–46.0)
MCHC: 33 g/dL (ref 30.0–36.0)
RDW: 14.1 % (ref 11.5–15.5)

## 2010-06-15 ENCOUNTER — Inpatient Hospital Stay (HOSPITAL_COMMUNITY): Payer: Medicare Other

## 2010-06-15 LAB — URINE CULTURE
Colony Count: 75000
Culture  Setup Time: 201203312059

## 2010-06-15 LAB — CBC
Hemoglobin: 10.9 g/dL — ABNORMAL LOW (ref 12.0–15.0)
MCHC: 32.7 g/dL (ref 30.0–36.0)
Platelets: 196 10*3/uL (ref 150–400)
RDW: 13.9 % (ref 11.5–15.5)

## 2010-06-15 LAB — BASIC METABOLIC PANEL
BUN: 10 mg/dL (ref 6–23)
CO2: 26 mEq/L (ref 19–32)
Calcium: 7.4 mg/dL — ABNORMAL LOW (ref 8.4–10.5)
Creatinine, Ser: 0.67 mg/dL (ref 0.4–1.2)
GFR calc non Af Amer: >60 mL/min (ref >60)
Glucose, Bld: 94 mg/dL (ref 70–99)
Sodium: 134 mEq/L — ABNORMAL LOW (ref 135–145)

## 2010-06-16 LAB — CBC
HCT: 39.2 % (ref 36.0–46.0)
MCHC: 32.3 g/dL (ref 30.0–36.0)
MCV: 82.9 fL (ref 78.0–100.0)
Platelets: 279 10*3/uL (ref 150–400)
WBC: 6.3 10*3/uL (ref 4.0–10.5)

## 2010-06-16 LAB — URINALYSIS, ROUTINE W REFLEX MICROSCOPIC
Bilirubin Urine: NEGATIVE
Glucose, UA: NEGATIVE mg/dL
Hgb urine dipstick: NEGATIVE
Ketones, ur: NEGATIVE mg/dL
Protein, ur: NEGATIVE mg/dL
pH: 6 (ref 5.0–8.0)

## 2010-06-16 LAB — POCT CARDIAC MARKERS
CKMB, poc: 1.3 ng/mL (ref 1.0–8.0)
Myoglobin, poc: 107 ng/mL (ref 12–200)

## 2010-06-16 LAB — COMPREHENSIVE METABOLIC PANEL
BUN: 23 mg/dL (ref 6–23)
CO2: 25 mEq/L (ref 19–32)
Calcium: 8.8 mg/dL (ref 8.4–10.5)
Chloride: 100 mEq/L (ref 96–112)
Creatinine, Ser: 0.95 mg/dL (ref 0.4–1.2)
GFR calc non Af Amer: 56 mL/min — ABNORMAL LOW (ref >60)
Total Bilirubin: 0.7 mg/dL (ref 0.3–1.2)

## 2010-06-16 LAB — DIFFERENTIAL
Basophils Absolute: 0.1 10*3/uL (ref 0.0–0.1)
Eosinophils Absolute: 0.1 10*3/uL (ref 0.0–0.7)
Lymphocytes Relative: 24 % (ref 12–46)
Monocytes Relative: 11 % (ref 3–12)
Neutrophils Relative %: 62 % (ref 43–77)

## 2010-06-16 LAB — URINE CULTURE: Colony Count: >=100000

## 2010-06-16 LAB — URINE MICROSCOPIC-ADD ON

## 2010-06-17 LAB — BASIC METABOLIC PANEL
CO2: 28 mEq/L (ref 19–32)
Calcium: 8.3 mg/dL — ABNORMAL LOW (ref 8.4–10.5)
Creatinine, Ser: 0.74 mg/dL (ref 0.4–1.2)
GFR calc Af Amer: >60 mL/min (ref >60)
GFR calc non Af Amer: >60 mL/min (ref >60)

## 2010-06-17 LAB — CBC
MCH: 29.9 pg (ref 26.0–34.0)
MCHC: 32.9 g/dL (ref 30.0–36.0)
Platelets: 221 10*3/uL (ref 150–400)
RDW: 13.5 % (ref 11.5–15.5)

## 2010-06-17 LAB — SURGICAL PCR SCREEN
MRSA, PCR: NEGATIVE
Staphylococcus aureus: NEGATIVE

## 2010-06-17 LAB — CROSSMATCH
ABO/RH(D): A POS
Antibody Screen: NEGATIVE

## 2010-06-18 ENCOUNTER — Inpatient Hospital Stay (HOSPITAL_COMMUNITY): Payer: Medicare Other

## 2010-06-18 ENCOUNTER — Other Ambulatory Visit: Payer: Self-pay | Admitting: Interventional Radiology

## 2010-06-18 MED ORDER — IOHEXOL 300 MG/ML  SOLN
50.0000 mL | Freq: Once | INTRAMUSCULAR | Status: AC | PRN
  Administered 2010-06-18: 5 mL

## 2010-06-19 LAB — CROSSMATCH
ABO/RH(D): A POS
Antibody Screen: NEGATIVE

## 2010-06-19 LAB — CBC
HCT: 21.9 % — ABNORMAL LOW (ref 36.0–46.0)
HCT: 27.8 % — ABNORMAL LOW (ref 36.0–46.0)
Hemoglobin: 7.1 g/dL — CL (ref 12.0–15.0)
Hemoglobin: 9 g/dL — ABNORMAL LOW (ref 12.0–15.0)
MCHC: 32.5 g/dL (ref 30.0–36.0)
MCHC: 32.4 g/dL (ref 30.0–36.0)
MCHC: 32.7 g/dL (ref 30.0–36.0)
Platelets: 181 10*3/uL (ref 150–400)
Platelets: 184 10*3/uL (ref 150–400)
Platelets: 245 10*3/uL (ref 150–400)
Platelets: 263 10*3/uL (ref 150–400)
RBC: 2.54 MIL/uL — ABNORMAL LOW (ref 3.87–5.11)
RBC: 3.02 MIL/uL — ABNORMAL LOW (ref 3.87–5.11)
RBC: 3.42 MIL/uL — ABNORMAL LOW (ref 3.87–5.11)
RDW: 16.3 % — ABNORMAL HIGH (ref 11.5–15.5)
RDW: 18.3 % — ABNORMAL HIGH (ref 11.5–15.5)
RDW: 16.5 % — ABNORMAL HIGH (ref 11.5–15.5)
RDW: 17.4 % — ABNORMAL HIGH (ref 11.5–15.5)
RDW: 18 % — ABNORMAL HIGH (ref 11.5–15.5)
WBC: 10.3 10*3/uL (ref 4.0–10.5)
WBC: 13.9 10*3/uL — ABNORMAL HIGH (ref 4.0–10.5)
WBC: 20 10*3/uL — ABNORMAL HIGH (ref 4.0–10.5)

## 2010-06-19 LAB — UIFE/LIGHT CHAINS/TP QN, 24-HR UR
Albumin, U: DETECTED
Alpha 1, Urine: DETECTED — AB
Alpha 2, Urine: DETECTED — AB
Free Kappa Lt Chains,Ur: 17.1 mg/dL — ABNORMAL HIGH (ref 0.04–1.51)
Free Kappa/Lambda Ratio: 5.26 ratio — ABNORMAL HIGH (ref 0.46–4.00)
Free Lambda Excretion/Day: 22.75 mg/d
Time: 24 hours
Total Protein, Urine: 29.7 mg/dL
Volume, Urine: 700 mL

## 2010-06-19 LAB — URINALYSIS, ROUTINE W REFLEX MICROSCOPIC
Glucose, UA: NEGATIVE mg/dL
Hgb urine dipstick: NEGATIVE
Specific Gravity, Urine: 1.026 (ref 1.005–1.030)
Urobilinogen, UA: 0.2 mg/dL (ref 0.0–1.0)

## 2010-06-19 LAB — DIFFERENTIAL
Basophils Absolute: 0 10*3/uL (ref 0.0–0.1)
Eosinophils Absolute: 0 10*3/uL (ref 0.0–0.7)
Eosinophils Relative: 0 % (ref 0–5)
Lymphs Abs: 1 10*3/uL (ref 0.7–4.0)
Neutrophils Relative %: 83 % — ABNORMAL HIGH (ref 43–77)

## 2010-06-19 LAB — HEMOGLOBIN AND HEMATOCRIT, BLOOD
HCT: 24.8 % — ABNORMAL LOW (ref 36.0–46.0)
Hemoglobin: 8 g/dL — ABNORMAL LOW (ref 12.0–15.0)

## 2010-06-19 LAB — BASIC METABOLIC PANEL
BUN: 10 mg/dL (ref 6–23)
CO2: 23 mEq/L (ref 19–32)
Calcium: 7.5 mg/dL — ABNORMAL LOW (ref 8.4–10.5)
Calcium: 7.7 mg/dL — ABNORMAL LOW (ref 8.4–10.5)
Chloride: 104 mEq/L (ref 96–112)
Creatinine, Ser: 0.92 mg/dL (ref 0.4–1.2)
GFR calc Af Amer: >60 mL/min (ref >60)
GFR calc Af Amer: >60 mL/min (ref >60)
GFR calc non Af Amer: >60 mL/min (ref >60)
Glucose, Bld: 107 mg/dL — ABNORMAL HIGH (ref 70–99)
Glucose, Bld: 96 mg/dL (ref 70–99)
Potassium: 4.1 mEq/L (ref 3.5–5.1)
Potassium: 4.8 mEq/L (ref 3.5–5.1)
Sodium: 133 mEq/L — ABNORMAL LOW (ref 135–145)
Sodium: 131 mEq/L — ABNORMAL LOW (ref 135–145)
Sodium: 137 mEq/L (ref 135–145)

## 2010-06-19 LAB — CARDIAC PANEL(CRET KIN+CKTOT+MB+TROPI)
CK, MB: 12.3 ng/mL — ABNORMAL HIGH (ref 0.3–4.0)
CK, MB: 4.1 ng/mL — ABNORMAL HIGH (ref 0.3–4.0)
Relative Index: 2 (ref 0.0–2.5)
Total CK: 189 U/L — ABNORMAL HIGH (ref 7–177)
Total CK: 430 U/L — ABNORMAL HIGH (ref 7–177)
Total CK: 602 U/L — ABNORMAL HIGH (ref 7–177)
Troponin I: 0.48 ng/mL — ABNORMAL HIGH (ref 0.00–0.06)

## 2010-06-19 LAB — PROTEIN ELECTROPHORESIS, SERUM
Albumin ELP: 52.4 % — ABNORMAL LOW (ref 55.8–66.1)
Beta 2: 5.3 % (ref 3.2–6.5)
Beta Globulin: 7.9 % — ABNORMAL HIGH (ref 4.7–7.2)
Gamma Globulin: 14.7 % (ref 11.1–18.8)
M-Spike, %: NOT DETECTED g/dL

## 2010-06-19 LAB — TROPONIN I: Troponin I: 0.07 ng/mL — ABNORMAL HIGH (ref 0.00–0.06)

## 2010-06-19 LAB — PROTEIN ELECTROPH W RFLX QUANT IMMUNOGLOBULINS
Alpha-1-Globulin: 7.4 % — ABNORMAL HIGH (ref 2.9–4.9)
Gamma Globulin: 13.6 % (ref 11.1–18.8)
M-Spike, %: NOT DETECTED g/dL
Total Protein ELP: 5.7 g/dL — ABNORMAL LOW (ref 6.0–8.3)

## 2010-06-19 LAB — ANA: Anti Nuclear Antibody(ANA): NEGATIVE

## 2010-06-19 LAB — POCT I-STAT, CHEM 8
BUN: 22 mg/dL (ref 6–23)
Creatinine, Ser: 1.1 mg/dL (ref 0.4–1.2)
Potassium: 4 mEq/L (ref 3.5–5.1)
Sodium: 138 mEq/L (ref 135–145)

## 2010-06-19 LAB — CK TOTAL AND CKMB (NOT AT ARMC)
CK, MB: 6.2 ng/mL — ABNORMAL HIGH (ref 0.3–4.0)
CK, MB: 8.5 ng/mL — ABNORMAL HIGH (ref 0.3–4.0)
Relative Index: 1.7 (ref 0.0–2.5)
Relative Index: 3.6 — ABNORMAL HIGH (ref 0.0–2.5)

## 2010-06-19 LAB — SEDIMENTATION RATE: Sed Rate: 10 mm/hr (ref 0–22)

## 2010-06-19 LAB — TSH: TSH: 10.749 u[IU]/mL — ABNORMAL HIGH (ref 0.350–4.500)

## 2010-06-19 LAB — T4, FREE: Free T4: 0.73 ng/dL — ABNORMAL LOW (ref 0.80–1.80)

## 2010-06-19 LAB — IGG, IGA, IGM: IgG (Immunoglobin G), Serum: 756 mg/dL (ref 694–1618)

## 2010-06-19 NOTE — Discharge Summary (Signed)
NAME:  Tiffany Austin, Tiffany Austin               ACCOUNT NO.:  [id number]  MEDICAL RECORD NO.:  [id number]           PATIENT TYPE:  I  LOCATION:  5525                         FACILITY:  [facility]  PHYSICIAN:  Andreas Blower, MD       DATE OF BIRTH:  Aug 10, 1924  DATE OF ADMISSION:  06/13/2010 DATE OF DISCHARGE:                        DISCHARGE SUMMARY - REFERRING   PRIMARY CARE PHYSICIAN:  The patient is unsure, but is with [facility]. She believes her primary care physician may be Dr. Clifton Custard.  DISCHARGE DIAGNOSES: 1. T12 vertebral plana compression fracture status post kyphoplasty. 2. Low back pain due to T12 compression fracture. 3. History of status post vertebral augmentation at T5. 4. Remote compression fractures of T8, T10, T11, L1, and L2. 5. Coag-negative Staph urinary tract infection. 6. Gastroesophageal reflux disease. 7. History of chronic chest wound. 8. Severe osteoporosis. 9. History of muscle spasms. 10.History of left bundle-branch block. 11.History of partial hysterectomy. 12.History of tonsillectomy. 13.History of adenoidectomy. 14.History of mild aortic sclerosis with mild aortic insufficiency. 15.Chronic anemia. 16.History of basal cell cancer  DISCHARGE MEDICATIONS: 1. Calcium carbonate with vitamin D 1 tablet p.o. twice daily. 2. Docusate 100 mg p.o. twice daily, hold if any diarrhea. 3. Fentanyl patch 25 mcg per hour transdermally every 3 days. 4. Oxycodone 5 mg IR every 6 hours as needed.  The patient given     prescription for 2 days. 5. Senna/docusate 8.6 - 50 two tablets daily at bedtime, hold if any     diarrhea. 6. Methocarbamol 500 mg every 8 hours as needed. 7. Mirtazapine 7.5 mg p.o. daily at bedtime as needed for sleep. 8. Multivitamin 1 tablet p.o. daily. 9. Omeprazole 20 mg daily as needed for reflux.  BRIEF ADMITTING HISTORY AND PHYSICAL:  [name] is an 77 year old Caucasian female with history of osteoporosis and multiple compression fractures and chronic  pain, who presented with worsening back pain and difficulty ambulating.  RADIOLOGY/IMAGING:  The patient had an MRI of the T-spine without contrast, which showed minimal residual edema within the vertebral plana compression fracture at T12.  Status post vertebral augmentation at T5. Remote compression fracture of T8, T10, T11, L1, and L2.  Mild foraminal narrowing bilaterally at T12 through L1 and T10 through T11.  Bilateral pleural effusions, right greater than left.  Dependent airspace disease in the lung.  The patient had a head CT without contrast on June 13, 2010, which shows prominent small-vessel disease type changes.  No CT evidence for large acute or intracranial hemorrhage.  The patient had chest x-ray, two-view, which shows no acute findings.  The patient had x-ray of the T-spine, which shows multiple level compression fractures without significant interval change.  LABORATORY DATA:  CBC shows a white count of 6.6, hemoglobin 11.2, hematocrit 34.0, and platelet count 221.  Electrolytes normal with a creatinine of 0.74.  MRSA by PCR was negative.  UA was negative for nitrites and leukocytes.  However, urine culture grew Staph aureus was sensitive to vancomycin, rifampin, and nitrofurantoin.  HOSPITAL COURSE BY PROBLEM: 1. Low back pain with T12 compression fracture and remote compression     fractures  at various levels.  The patient was evaluated by     Interventional Radiology and had kyphoplasty done on June 18, 2010.     The patient was also started on a fentanyl patch with good     improvement in her pain.  The patient was evaluated by physical     therapy and thought that the patient would benefit from skilled     nursing facility after discharge. 2. Coag-negative Staph urinary tract infection.  The patient was     started on vancomycin on June 15, 2010.  The patient to have     completed a 5-day course at the time of discharge on June 19, 2010. 3. GERD.  Continue  the patient on PPI. 4. Chronic chest wound from a procedure done.  The patient was     evaluated by wound care and thought that the area could be covered     up with dressing and change daily.  The patient will need     outpatient followup for the chest wound. 5. History of basal cell cancer of forehead status post resection in     the past.  DISPOSITION AND FOLLOWUP:  The patient to follow up with her primary care physician in 1 week.  The patient to also have her chest wound evaluated by Dermatology as an outpatient.  Time spent on discharge talking to the patient and coordinating care was 35 minutes.   Andreas Blower, MD   SR/MEDQ  D:  06/19/2010  T:  06/19/2010  Job:  [id number]  Electronically Signed by Wardell Heath Libbey Duce  on 06/19/2010 08:47:50 PM

## 2010-06-23 DIAGNOSIS — K219 Gastro-esophageal reflux disease without esophagitis: Secondary | ICD-10-CM

## 2010-06-23 DIAGNOSIS — G479 Sleep disorder, unspecified: Secondary | ICD-10-CM

## 2010-06-23 DIAGNOSIS — M810 Age-related osteoporosis without current pathological fracture: Secondary | ICD-10-CM

## 2010-06-24 ENCOUNTER — Other Ambulatory Visit (HOSPITAL_COMMUNITY): Payer: Self-pay | Admitting: Interventional Radiology

## 2010-06-24 DIAGNOSIS — IMO0002 Reserved for concepts with insufficient information to code with codable children: Secondary | ICD-10-CM

## 2010-06-26 DIAGNOSIS — R112 Nausea with vomiting, unspecified: Secondary | ICD-10-CM

## 2010-07-01 ENCOUNTER — Ambulatory Visit (HOSPITAL_COMMUNITY): Payer: Medicare Other

## 2010-07-03 NOTE — Telephone Encounter (Signed)
This has already been taken care of

## 2010-07-03 NOTE — Telephone Encounter (Signed)
Nikki, has this been done? 

## 2010-07-04 ENCOUNTER — Emergency Department: Payer: Self-pay | Admitting: Emergency Medicine

## 2010-07-06 ENCOUNTER — Telehealth: Payer: Self-pay | Admitting: *Deleted

## 2010-07-06 NOTE — Telephone Encounter (Signed)
Thank you :)

## 2010-07-06 NOTE — Telephone Encounter (Signed)
Triage Record Num: [id number] Operator: Frederico Hamman Patient Name: Tiffany Austin Call Date & Time: 07/04/2010 1:03:03PM Patient Phone: [phone] PCP: Tillman Abide Caller Name: Edrick Oh Relationship to Patient: Unknown Patient Gender: Female PCP Fax : [phone] Patient DOB: 06-22-1924 Practice Name: [facility] Reason for Call: Olegario Messier from [facility] states [name] has BP of 80/40. Normal Systolic BP 118-120. C/o weakness, sleepiness and fatigue. HR -96. Pulse ox 94%. Refuses to drink. Needs assistance with activities. C/o weakness. Advised to see provider within 4 hr. Advised [facility]. Care advice given. Protocol(s) Used: Dehydration Recommended Outcome per Protocol: See Provider within 4 hours Reason for Outcome: New signs of dehydration and unable to replace fluid loss orally Care Advice: Call EMS 911 if signs and symptoms of shock develop (such as unable to stand due to faintness, dizziness, or lightheadedness; new onset of confusion; slow to respond or difficult to awaken; skin is pale, gray, cool, or moist to touch; severe weakness; loss of consciousness). ~ ~ IMMEDIATE ACTION 04/

## 2010-07-06 NOTE — Telephone Encounter (Signed)
Please call [facility] to check on pt.

## 2010-07-06 NOTE — Telephone Encounter (Signed)
Spoke with Archie Patten from [facility] and she stated that Tiffany Austin seems to be doing fine this morning.  She was up and had physicial therapy and now she has left for an outside appt with her daughter.

## 2010-07-06 NOTE — Telephone Encounter (Signed)
Spoke with someone from [facility] and they will have her nurse give me a call back.

## 2010-07-08 ENCOUNTER — Ambulatory Visit (HOSPITAL_COMMUNITY)
Admission: RE | Admit: 2010-07-08 | Discharge: 2010-07-08 | Disposition: A | Discharge status: Home / Self Care | Payer: Medicare Other | Source: Ambulatory Visit | Attending: Interventional Radiology | Admitting: Interventional Radiology

## 2010-07-08 DIAGNOSIS — IMO0002 Reserved for concepts with insufficient information to code with codable children: Secondary | ICD-10-CM

## 2010-07-09 ENCOUNTER — Other Ambulatory Visit: Payer: Self-pay | Admitting: Family Medicine

## 2010-07-09 DIAGNOSIS — R898 Other abnormal findings in specimens from other organs, systems and tissues: Secondary | ICD-10-CM

## 2010-07-09 NOTE — H&P (Signed)
NAME:  Tiffany Austin, Tiffany Austin               ACCOUNT NO.:  [id number]  MEDICAL RECORD NO.:  [id number]          PATIENT TYPE:  INP  LOCATION:                               FACILITY:  [facility]  PHYSICIAN:  Tarry Kos, MD       DATE OF BIRTH:  Apr 05, 1924  DATE OF ADMISSION: DATE OF DISCHARGE:                             HISTORY & PHYSICAL   CHIEF COMPLAINT:  Cannot walk.  HISTORY OF PRESENT ILLNESS:  [name] is an 77 year old female who has severe osteoporosis and who has multiple compression fractures and chronic pain who over the last several days has had worsening ambulation and more back pain than normal.  She was recently diagnosed with UTI last week.  She has been on ciprofloxacin for that.  She has not had any recent trauma; however, she has had severe osteoporosis, and she has had compression fractures from just being turned over in the bed before.  Her daughter takes care of her at home and has called her PCP, Dr. Dayton Austin, and they are already in the process of getting her placed in a nursing home.  She denies any fevers, denies any abdominal pain, chest pain.  She does take Dilaudid 4 mg that is written every 4 hours, however, yesterday, she did not take any because she was overly sedated with a decrease in her pain meds, so her family was also worried about that yesterday.  Today, she is back to her normal baseline mental status which is alert and oriented x4.  She has multiple skin cancers that have not been treating yet.  Otherwise, review of systems is otherwise negative.  PAST MEDICAL HISTORY: 1. Severe osteoporosis. 2. Muscle spasms. 3. History of left bundle-branch block. 4. Partial hysterectomy. 5. Tonsillectomy. 6. Adenoidectomy.  SOCIAL HISTORY:  She does not smoke.  No alcohol, no IV drug abuse. Last time, she was in a rehab facility was 2 years ago.  Again, her PCP has already worked on getting her into a skilled nursing facility.  She is  DNR.  MEDICATIONS: 1. She has got Cipro 500 mg twice a day which was started on Tuesday,     4 days ago for UTIs for a 10-day course. 2. Dilaudid 4 mg every 4 hours as needed for pain. 3. Muscle spasm medication. 4. Sleeping aid medicine,  ALLERGIES:  None.  PHYSICAL EXAMINATION:  VITAL SIGNS:  Blood pressure 132/63, pulse 96, respirations 20, temperature 97.3, 98% O2 sats on 2 liters nasal cannula. GENERAL:  She is alert and oriented, in no apparent distress, cooperating fairly, chronically ill appearing and fragile. HEENT:  Extraocular movements intact.  Pupils equal to light and reactive to light.  Oropharynx clear.  Mucous membranes moist. NECK:  No JVD.  No carotid bruits. CARDIAC:  Regular rate and rhythm without murmurs, rubs, or gallops. CHEST:  Clear to auscultation bilaterally.  No wheeze, rhonchi, or rales. ABDOMEN:  Soft, nontender, nondistended.  Positive bowel sounds.  No hepatosplenomegaly. EXTREMITIES:  No clubbing, cyanosis, or edema. PSYCH:  Normal affect.  No focal neurological deficits. MUSCULOSKELETAL:  Her pelvis is intact.  She can  move her hips without any pain.  No fractures of the femur bilaterally.  Decreased strength throughout.  LABORATORY DATA:  Urinalysis that shows ketones, no other infection. White count is 12.4, hemoglobin is 12.  BUN and creatinine are normal.  Chest x-ray, nothing acute. T-spine, multiple compression fractures without any change since June 09, 2010.  ASSESSMENT/PLAN:  This is an 77 year old female with ataxia secondary to multiple compression fractures. 1. Uncontrolled pain secondary to multiple compression fractures.  I     think the best course of action right now is starting her on     something longer acting than the Dilaudid.  I am going to place the     fentanyl patch at a low dose of 25 mcg an hour for 72 hours.  They     do not recall if she had ever been on any patches before.  She     might have been on  Lidoderm patches before but not on fentanyl.  We     will obtain a social work consult for placement and a physical     therapy evaluation, but we really need to get her pain under     control.  I have also written for some oxycodone for breakthrough     pain and Dilaudid IV. 2. Severe osteoporosis as above. 3. Altered mental status from yesterday has resolved.  I am going to     obtain a CT of her head to rule out a cerebrovascular accident;     however, she does not have anything focally at this time. 4. The patient is do not resuscitate.  Further recommendations     depending on overall hospital course.  She will need placement.          ______________________________ Tarry Kos, MD     RD/MEDQ  D:  06/13/2010  T:  06/14/2010  Job:  [id number]  Electronically Signed by Tarry Kos MD on 07/09/2010 06:27:17 AM

## 2010-07-10 DIAGNOSIS — M546 Pain in thoracic spine: Secondary | ICD-10-CM

## 2010-07-13 ENCOUNTER — Ambulatory Visit: Payer: Self-pay | Admitting: Internal Medicine

## 2010-07-14 ENCOUNTER — Ambulatory Visit: Payer: Self-pay | Admitting: Internal Medicine

## 2010-07-15 DIAGNOSIS — F329 Major depressive disorder, single episode, unspecified: Secondary | ICD-10-CM

## 2010-07-15 DIAGNOSIS — IMO0002 Reserved for concepts with insufficient information to code with codable children: Secondary | ICD-10-CM

## 2010-07-15 DIAGNOSIS — M546 Pain in thoracic spine: Secondary | ICD-10-CM

## 2010-07-28 NOTE — Letter (Signed)
May 18, 2007    Tiffany A. Tower, MD  [address] [place], [place] [address]   RE:  Tiffany, Austin  MRN:  [id number]  /  DOB:  04-07-1924   Dear Tiffany Austin:   It was a pleasure to see Tiffany Austin at your request to evaluate both  an abnormal BNP as well as dyspnea and recurrent spells.   She is an 77 year old woman who comes today with her daughter.  She  brings with her a number or records describing part of the litany of  these problems that go back many years, though apparently they have  gotten worse.  Specifically, the spells at first continue to occur at a  frequency of every month or so.  They are abrupt in onset.  They drop  her to the floor.  She has passed out and hurt herself on at least one  occasion where she ended up with a black eye and more recently she has  had a broken foot.  She ends up sitting on the ground for 5-30 minutes  afterwards, although it is not clear that the spells itself lasts that  Tiffany Austin.  These dizzy spells occur both lying, sitting, and standing  although they are much worse when she is standing.  Holter monitoring  was suggested some years ago by Dr. Gwen Austin; I do not see any results  from that.  She is now noted to have a left bundle branch block and I do  not know how Tiffany Austin that goes back.  The other complaint is dyspnea with exertion.  This has been a  progressive problem and much worse over the last number of months.  Laboratory that you drew a couple of weeks ago demonstrated a BNP of  over 200.  She does not have accompanying chest discomfort, does not  have a history of peripheral edema, orthopnea, or nocturnal dyspnea.  Myoview scan in 2003, demonstrated normal LV function and no evidence of  a perfusion defect.   Her cardiac risk factors are notable for a dyslipidemia but she does not  have hypertension.  She does not smoke.   She also has a history of anemia the severity of which is only minimal  at this point anyway.   PAST  MEDICAL HISTORY:  1. Problems with osteoporosis and compression fractures.  2. Significant fatigue as noted previously.   PAST SURGICAL HISTORY:  Described as zero.   SOCIAL HISTORY:  She is widowed.  She has 2 children, grandchildren, and  great grandchildren.  She does not use cigarettes or recreational drugs.  She does drink alcohol occasionally.   MEDICATIONS:  Aspirin, Aranesp, vitamin D, Boniva.   ALLERGIES:  1. MIACALCIN.  2. EVISTA.   There is a discrepancy which I have not appreciated till now that says  she takes [place] and she is also allergic to [facility].   PHYSICAL EXAMINATION:  VITAL SIGNS:  Her initial blood pressure was  146/79, after recumbent rest it was 121/70 with a pulse of 72, sitting  at zero minutes was 110/67 with a pulse of 76, at zero minutes of  standing it was 101/67 with a pulse of 81, with gradual re-  equilibration of her heart rate and her blood pressure to 124/78 at five  minutes with a heart rate of 78.  HEENT:  Demonstrated no icterus or xanthomata.  The carotids were brisk  and full without bruits.  Carotid sinus massage was undertaken (see  below).  BACK:  Without scoliosis.  There was moderate kyphosis.  LUNGS:  Clear.  HEART:  Sounds were regular without murmurs or gallops.  ABDOMEN:  Soft with active bowel sounds without midline pulsation or  hepatomegaly.  Femoral pulses were 2 plus.  Distal pulses were intact.  There was no clubbing, cyanosis, or edema.  NEUROLOGIC:  Grossly normal.  SKIN:  Warm and dry.   Electrocardiogram dated today demonstrated sinus rhythm at 75 with  intervals of 0.17, 0.12, 0.43.  Electrocardiogram from your office had a  QRS duration a little bit broader at 136 consistent with left bundle  branch block.   IMPRESSION:  1. Dyspnea on exertion, progressive.  2. Elevated BNP, question related to number one.  3. Previously normal left ventricular systolic function and      nonischemic Myoview.  4. Recurrent  spells of presyncope and syncope.  5. Left bundle branch block.  6. Carotid sinus massage negative, although there was some modest      heart rate slowing.  7. Anemia treated with Aranesp.   Tiffany, the two issues that we may be able to help with in Tiffany Austin  include the dyspnea as well as the spells.  As it relates to the former,  her elevated BNP would suggest there is a cardiac component to this and  an echo will help [place] understand whether this is systolic or diastolic  potentially.  I have reviewed this with the family and they are willing  to proceed that way.  The presence of a left bundle branch block of  unknown duration would suggest the presence of a myopathic process of  some kind.   Her spells were also concerning especially in the context of her bundle  branch block.  The abruptness of their onset would suggest an  arrhythmia.  The fact that most of them occur while standing, raises the  possibility that there is an orthostatic component which was suggested  today but not strongly so by her aforementioned vital signs.  Loop  monitoring is, I think, the most effective way to try to clarify this.  We discussed the use of initially using Life Watch; in the event this is  unrevealing that is she has no spells in the 3-4 weeks of its use, I  would at that point recommend the implantation of a loop recorder as  many of 60% of patients with left bundle branch block and recurrent  falls will have sinus node dysfunction or intermittent complete heart  block responsible for their spells.  Given the trauma of potential  associated with these falls as manifested already, pacing is useful in  trying to reduce that risk.   Thank you very much for asking [place] to see her.  Tiffany Austin, we will plan to  review the aforementioned results with her in about 4 weeks' time.  If  there is anything I can do in the interim, please do not hesitate to  contact me.    Sincerely,      Duke Salvia, MD, [facility]  Electronically Signed    SCK/MedQ  DD: 05/18/2007  DT: 05/18/2007  Job #: [id number]

## 2010-08-06 ENCOUNTER — Telehealth: Payer: Self-pay | Admitting: *Deleted

## 2010-08-06 NOTE — Telephone Encounter (Signed)
Pharmacy is calling to to check the authenticity of the rx for the fentanyl patch that is dated for 08-04-10. Copy of rx is on your desk.

## 2010-08-07 ENCOUNTER — Telehealth: Payer: Self-pay | Admitting: *Deleted

## 2010-08-07 NOTE — Telephone Encounter (Signed)
Pharmacy has faxed requests to change orders,  Notes are on your desk.

## 2010-08-07 NOTE — Telephone Encounter (Signed)
Per Dr. Dayton Martes, advised ok to change prosource to whatever comparable at the [facility], ok to change medpass to ensure.

## 2010-08-07 NOTE — Telephone Encounter (Signed)
Sorry this was supposed to be sent to Dr. Alphonsus Sias.

## 2010-08-07 NOTE — Telephone Encounter (Signed)
I don't see this rx on my desk.

## 2010-08-09 NOTE — Telephone Encounter (Signed)
I did revise this Rx--I forgot to circle my name It is authentic!!

## 2010-08-14 ENCOUNTER — Telehealth: Payer: Self-pay | Admitting: *Deleted

## 2010-08-14 ENCOUNTER — Ambulatory Visit: Payer: Self-pay | Admitting: Internal Medicine

## 2010-08-14 NOTE — Telephone Encounter (Signed)
Pt has dropped off a form that pt needs completed for her insurance company, form is on your desk.

## 2010-08-17 NOTE — Telephone Encounter (Signed)
I am unable to answer certain questions, on my desk for further follow up.

## 2010-08-18 NOTE — Telephone Encounter (Signed)
Left message on machine for daughter to return call

## 2010-08-19 DIAGNOSIS — M6281 Muscle weakness (generalized): Secondary | ICD-10-CM

## 2010-08-19 DIAGNOSIS — G603 Idiopathic progressive neuropathy: Secondary | ICD-10-CM

## 2010-08-19 DIAGNOSIS — M8448XD Pathological fracture, other site, subsequent encounter for fracture with routine healing: Secondary | ICD-10-CM

## 2010-08-19 DIAGNOSIS — R262 Difficulty in walking, not elsewhere classified: Secondary | ICD-10-CM

## 2010-08-20 DIAGNOSIS — Z0279 Encounter for issue of other medical certificate: Secondary | ICD-10-CM

## 2010-08-20 NOTE — Telephone Encounter (Signed)
Spoke with daughter, form ready for pick up will be left at front desk.  She is aware of $20.00 charge.

## 2010-08-21 ENCOUNTER — Other Ambulatory Visit: Payer: Self-pay | Admitting: *Deleted

## 2010-08-21 MED ORDER — FENTANYL 25 MCG/HR TD PT72: 1.0000 | MEDICATED_PATCH | TRANSDERMAL | Status: AC

## 2010-08-21 NOTE — Telephone Encounter (Signed)
Rayfield Citizen at [facility] is asking for a refill on fentanyl patches, pt has one box left.  This is not on med list, please call when ready.

## 2010-08-21 NOTE — Telephone Encounter (Signed)
Tiffany Austin advised that Rx is ready for pick up will be left at front desk.

## 2010-08-24 ENCOUNTER — Encounter: Payer: Self-pay | Admitting: Cardiovascular Disease

## 2010-08-27 ENCOUNTER — Encounter: Payer: Self-pay | Admitting: Family Medicine

## 2010-08-27 ENCOUNTER — Ambulatory Visit (INDEPENDENT_AMBULATORY_CARE_PROVIDER_SITE_OTHER): Payer: 59 | Admitting: Family Medicine

## 2010-08-27 VITALS — BP 92/60 | HR 60 | Temp 97.6°F | Wt 94.5 lb

## 2010-08-27 DIAGNOSIS — S22009A Unspecified fracture of unspecified thoracic vertebra, initial encounter for closed fracture: Secondary | ICD-10-CM

## 2010-08-27 NOTE — Progress Notes (Signed)
77 yo female here for follow up s/p SNF rehab placement.  She has h/o severe osteoporosis and who has multiple compression fractures  and chronic anemia.  Most recent vertebral surgery s/p lumbar compression fx in 06/18/2010. Was originally at [facility], had to be moved to the [place] for [employer] purposes.  Working with PT/OT. Not eating much, does not like the food. On Remeron but often refuses to take it. Here with her daughter today. Wt Readings from Last 3 Encounters:  08/27/10 94 lb 8 oz (42.865 kg)  08/20/09 105 lb (47.628 kg)  06/23/09 109 lb (49.442 kg)     ROS:  See HPI  Patient Active Problem List  Diagnoses  . HELICOBACTER PYLORI GASTRITIS  . HYPOTHYROIDISM  . HYPERKALEMIA  . ANEMIA OF CHRONIC DISEASE  . DEPRESSION  . PERIPHERAL NEUROPATHY  . LBBB  . GERD  . PEPTIC ULCER DISEASE  . UTI  . DEGENERATIVE DISC DISEASE, LUMBAR SPINE  . BACK PAIN, THORACIC REGION  . BACK PAIN, CHRONIC  . OSTEOPOROSIS  . DIZZINESS, CHRONIC  . Other Malaise and Fatigue  . INCONTINENCE  . VERTEBRAL FRACTURE, THORACIC SPINE  . FRACTURE, PELVIS  . CLOSED FRACTURE OF UNSPECIFIED BONE OF FOOT  . SKIN CANCER, HX OF   Past Medical History  Diagnosis Date  . GERD (gastroesophageal reflux disease)   . Osteoporosis   . Peptic ulcer disease   . Anemia of chronic disease   . Iron deficiency   . Peripheral neuropathy     Symptoms of  . Varicose veins   . Dizziness     chronic   Past Surgical History  Procedure Date  . Tonsillectomy   . Abdominal hysterectomy     Fibroids, ovaries intact, age 61  . Bladder repair     With hyst  . Appendectomy   . Cataract extraction     Implant  . Dexa 06/2000    Slightly improved osteopenia  . Spine surgery 07/2000    Compound fracture sping - T9, L1  . Fracture surgery     L2, L3 compoud fracture in the past  . Cardiolite 08/2001    Neg EF 65%  . Dexa 01/2003    OP at femeral neck  . Colonoscopy 02/2004    Divertics, hems  . Dexa  01/2005    Osteopenia at femoral neck  . Foot surgery 2007  . Fetal blood transfusion 10/2005  . Esophagogastroduodenoscopy 11/2005    Gastic ulcer  . Esophagogastroduodenoscopy 02/2006    Schatzki ring, HH, mucosal abnormalities, Bx H pylori  . Esophagogastroduodenoscopy 01/2008    Schatzki ring, HH    Family History  Problem Relation Age of Onset  . Stroke Father   . Heart attack Brother     MI  . Heart attack Brother     MI  . Heart attack Brother     MI  . Colon cancer Other     Grandfather   Allergies  Allergen Reactions  . Alprazolam     REACTION: headaches  . Calcitonin ([place])     REACTION: dizzy  . Ibandronate Sodium     REACTION: dysphagia  . Raloxifene     REACTION: cramps   Current Outpatient Prescriptions on File Prior to Visit  Medication Sig Dispense Refill  . acetaminophen (TYLENOL) 650 MG CR tablet Take 1,200 mg by mouth 2 (two) times daily in the am and at bedtime..        . aspirin 81 MG EC  tablet Take 81 mg by mouth daily.        Marland Kitchen CALCIUM-VITAMIN D PO Take by mouth.        . Cholecalciferol (VITAMIN D-400 PO) Take by mouth.        . DAILY MULTIPLE VITAMINS PO Take 1 tablet by mouth daily.        . fentaNYL (DURAGESIC - DOSED MCG/HR) 25 MCG/HR Place 1 patch (25 mcg total) onto the skin every 3 (three) days.  10 patch  0  . hydrocodone-acetaminophen (LORCET-HD) 5-500 MG per capsule Take 1/2 tablet by mouth up to every 6 hours during the day as needed for pain. *WATCH FOR SEDATION*  60 capsule  0  . Ibandronate Sodium (BONIVA) 2.5 MG TABS Take 1 tablet by mouth every 30 (thirty) days.        Marland Kitchen levothyroxine (SYNTHROID, LEVOTHROID) 25 MCG tablet Take 25 mcg by mouth daily.        Marland Kitchen LORazepam (ATIVAN) 0.5 MG tablet Take 0.5 mg by mouth at bedtime as needed.        . mirtazapine (REMERON) 7.5 MG tablet Take 7.5 mg by mouth at bedtime.        Marland Kitchen omeprazole (PRILOSEC) 20 MG capsule Take 20 mg by mouth daily.          The PMH, PSH, Social History, Family  History, Medications, and allergies have been reviewed in [facility], and have been updated if relevant.   PHYSICAL EXAMINATION:  BP 92/60  Pulse 60  Temp(Src) 97.6 F (36.4 C) (Oral)  Wt 94 lb 8 oz (42.865 kg)  GENERAL: She is alert and oriented, in no apparent distress,  cooperating fairly, chronically ill appearing and fragile.  HEENT: Extraocular movements intact. Pupils equal to light and  reactive to light. Oropharynx clear. Mucous membranes moist.  NECK: No JVD. No carotid bruits.  CARDIAC: Regular rate and rhythm without murmurs, rubs, or gallops.  CHEST: Clear to auscultation bilaterally. No wheeze, rhonchi, or  rales.  ABDOMEN: Soft, nontender, nondistended. Positive bowel sounds. No  hepatosplenomegaly.  EXTREMITIES: No clubbing, cyanosis, or edema.  PSYCH: Normal affect. No focal neurological deficits.    1. VERTEBRAL FRACTURE, THORACIC SPINE   >25 min spent with face to face with patient counseling and coordinating care. Pain appears well controlled. My biggest concern is her weight. Agreed to try Marinol, rx given.

## 2010-09-01 ENCOUNTER — Emergency Department: Payer: Self-pay | Admitting: Unknown Physician Specialty

## 2010-09-07 ENCOUNTER — Telehealth: Payer: Self-pay | Admitting: *Deleted

## 2010-09-07 MED ORDER — MECLIZINE HCL 12.5 MG PO TABS: 12.5000 mg | ORAL_TABLET | Freq: Three times a day (TID) | ORAL | Status: DC | PRN

## 2010-09-07 NOTE — Telephone Encounter (Signed)
Pt went to [facility] on 6/19 for dizziness and was given order for antivert, not a script- no doctor's signature on order.  They need a prescription and are asking if you will prescribe the medicine.

## 2010-09-07 NOTE — Telephone Encounter (Signed)
rx sent

## 2010-09-29 ENCOUNTER — Other Ambulatory Visit: Payer: Self-pay | Admitting: *Deleted

## 2010-09-29 NOTE — Telephone Encounter (Signed)
Rayfield Citizen at [facility] is asking for a written script for fentanyl 25 mcg patches, this is not on med list. Please call her when ready.

## 2010-09-30 NOTE — Telephone Encounter (Signed)
Rx in my box.

## 2010-10-01 ENCOUNTER — Other Ambulatory Visit: Payer: Self-pay | Admitting: *Deleted

## 2010-10-01 MED ORDER — FENTANYL 25 MCG/HR TD PT72: 1.0000 | MEDICATED_PATCH | TRANSDERMAL | Status: DC

## 2010-10-01 NOTE — Telephone Encounter (Signed)
Spoke with Tiffany Austin and advised that the Rx is ready for pick up, will be left at front desk.

## 2010-10-01 NOTE — Telephone Encounter (Signed)
Rx ready for pick up.  Called [facility] and got no answer, will call back later.

## 2010-10-07 ENCOUNTER — Ambulatory Visit: Payer: Self-pay | Admitting: Internal Medicine

## 2010-10-12 ENCOUNTER — Telehealth: Payer: Self-pay | Admitting: *Deleted

## 2010-10-12 NOTE — Telephone Encounter (Signed)
Advised Tiffany Austin via telephone, order for wheelchair left at front desk.

## 2010-10-12 NOTE — Telephone Encounter (Signed)
IN my box.

## 2010-10-12 NOTE — Telephone Encounter (Signed)
Pt's daughter is requesting an order for a wheelchair- she just wants a standard chair but needs an extra pad for the seat.  Please call when ready.  Pt does ok with her walker but sometimes gets tired and needs to sit down.

## 2010-10-14 ENCOUNTER — Ambulatory Visit: Payer: Self-pay | Admitting: Internal Medicine

## 2010-10-27 ENCOUNTER — Other Ambulatory Visit: Payer: Self-pay | Admitting: *Deleted

## 2010-10-27 MED ORDER — FENTANYL 25 MCG/HR TD PT72: 1.0000 | MEDICATED_PATCH | TRANSDERMAL | Status: DC

## 2010-10-27 NOTE — Telephone Encounter (Signed)
Left message with Tresa Endo at [facility] advising script is ready for pick up.

## 2010-10-27 NOTE — Telephone Encounter (Signed)
Please call [name] when ready. 

## 2010-11-04 ENCOUNTER — Encounter: Payer: Self-pay | Admitting: Family Medicine

## 2010-11-04 ENCOUNTER — Telehealth: Payer: Self-pay | Admitting: *Deleted

## 2010-11-04 NOTE — Telephone Encounter (Signed)
Pt's daughter called yesterday.  She said pt was at the dentist, in the chair,and the dentist wanted to know if prolia is a bisphosinate.  I called [facility] and was told that it is not, it's in a different class.  Advised daughter.

## 2010-11-05 ENCOUNTER — Ambulatory Visit: Payer: 59 | Admitting: Family Medicine

## 2010-11-05 NOTE — Telephone Encounter (Signed)
Dr. Dayton Martes, have you seen this note?

## 2010-11-06 ENCOUNTER — Other Ambulatory Visit: Payer: Self-pay | Admitting: *Deleted

## 2010-11-06 ENCOUNTER — Encounter: Payer: Self-pay | Admitting: Family Medicine

## 2010-11-06 NOTE — Telephone Encounter (Signed)
Noted! Thank you

## 2010-11-25 ENCOUNTER — Ambulatory Visit: Payer: 59 | Admitting: Family Medicine

## 2010-11-26 ENCOUNTER — Other Ambulatory Visit: Payer: Self-pay | Admitting: *Deleted

## 2010-11-26 NOTE — Telephone Encounter (Signed)
Please call Tiffany Austin when ready.

## 2010-11-27 MED ORDER — FENTANYL 25 MCG/HR TD PT72: 1.0000 | MEDICATED_PATCH | TRANSDERMAL | Status: DC

## 2010-11-30 MED ORDER — FENTANYL 25 MCG/HR TD PT72: 1.0000 | MEDICATED_PATCH | TRANSDERMAL | Status: DC

## 2010-11-30 NOTE — Telephone Encounter (Signed)
Original Rx was either not printed or was destroyed by mistake.  I printed another Rx and put in your IN box to be signed on tomorrow.

## 2010-12-01 NOTE — Telephone Encounter (Signed)
Rx ready for pick up will be left at front desk.  Tiffany Austin via telephone.

## 2010-12-02 ENCOUNTER — Telehealth: Payer: Self-pay | Admitting: *Deleted

## 2010-12-02 NOTE — Telephone Encounter (Signed)
Tiffany Austin is asking that order for skilled nursing care be faxed to the [facility] at fax number [phone].  Pt has a stage 2 ulcer on her ankle that needs care.  Please call her if any questions.

## 2010-12-03 NOTE — Telephone Encounter (Signed)
Order written on my desk.

## 2010-12-03 NOTE — Telephone Encounter (Signed)
Order Skilled nursing and Rx for Keflex faxed to the [facility] at [phone].  Rx for Keflex was for the urine and C&S we received from [facility].

## 2010-12-04 ENCOUNTER — Encounter: Payer: Self-pay | Admitting: Family Medicine

## 2010-12-04 ENCOUNTER — Ambulatory Visit (INDEPENDENT_AMBULATORY_CARE_PROVIDER_SITE_OTHER): Payer: Medicare Other | Admitting: Family Medicine

## 2010-12-04 VITALS — BP 110/80 | HR 60 | Temp 98.6°F | Wt 91.0 lb

## 2010-12-04 DIAGNOSIS — M810 Age-related osteoporosis without current pathological fracture: Secondary | ICD-10-CM

## 2010-12-04 DIAGNOSIS — M5137 Other intervertebral disc degeneration, lumbosacral region: Secondary | ICD-10-CM

## 2010-12-04 DIAGNOSIS — S22009A Unspecified fracture of unspecified thoracic vertebra, initial encounter for closed fracture: Secondary | ICD-10-CM

## 2010-12-04 DIAGNOSIS — Z87311 Personal history of (healed) other pathological fracture: Secondary | ICD-10-CM

## 2010-12-04 DIAGNOSIS — L8990 Pressure ulcer of unspecified site, unspecified stage: Secondary | ICD-10-CM | POA: Insufficient documentation

## 2010-12-04 DIAGNOSIS — R42 Dizziness and giddiness: Secondary | ICD-10-CM

## 2010-12-04 DIAGNOSIS — R627 Adult failure to thrive: Secondary | ICD-10-CM | POA: Insufficient documentation

## 2010-12-04 MED ORDER — DENOSUMAB 60 MG/ML ~~LOC~~ SOLN
60.0000 mg | SUBCUTANEOUS | Status: AC
  Administered 2010-12-04: 60 mg via SUBCUTANEOUS

## 2010-12-04 MED ORDER — MIRTAZAPINE 15 MG PO TABS: ORAL_TABLET | ORAL | Status: DC

## 2010-12-04 NOTE — Progress Notes (Signed)
77 yo female here for follow up and prolia injection.  She has h/o severe osteoporosis and who has multiple compression fractures  and chronic anemia.  Most recent vertebral surgery s/p lumbar compression fx in 06/18/2010. Was originally at [facility], had to be moved to the [place] for [employer] purposes.  Vertigo- still having episodes of room spinning when she changes position. Denies CP or SOB.  Usually resolves within minutes, has prn meclizine which is helpful as well. Sometimes is associated with nausea.  Receiving prolia every 6 months, second injection due today.  Working with PT/OT.  No pain.  Failure to thrive- Not eating much, daughter says she will not go eat.  Wants to stay in bed all day. Taking Remeron 7.5 mg qhs. Here with her daughter today. Added Marinol in June.   Daughter feels she is depressed, Ms. Osterhout says she's not sad just doesn't want to do anything but lay around. No SI. Does not like [place].  Stage 2 pressure ulcer on left ankle- sleeps in fetal position.  SNF is dressing it.   Wt Readings from Last 3 Encounters:  12/04/10 91 lb (41.277 kg)  08/27/10 94 lb 8 oz (42.865 kg)  08/20/09 105 lb (47.628 kg)      ROS:  See HPI  Patient Active Problem List  Diagnoses  . HELICOBACTER PYLORI GASTRITIS  . HYPOTHYROIDISM  . HYPERKALEMIA  . ANEMIA OF CHRONIC DISEASE  . DEPRESSION  . PERIPHERAL NEUROPATHY  . LBBB  . GERD  . PEPTIC ULCER DISEASE  . UTI  . DEGENERATIVE DISC DISEASE, LUMBAR SPINE  . BACK PAIN, THORACIC REGION  . BACK PAIN, CHRONIC  . OSTEOPOROSIS  . DIZZINESS, CHRONIC  . Other Malaise and Fatigue  . INCONTINENCE  . VERTEBRAL FRACTURE, THORACIC SPINE  . FRACTURE, PELVIS  . CLOSED FRACTURE OF UNSPECIFIED BONE OF FOOT  . SKIN CANCER, HX OF   Past Medical History  Diagnosis Date  . GERD (gastroesophageal reflux disease)   . Osteoporosis   . Peptic ulcer disease   . Anemia of chronic disease   . Iron deficiency   . Peripheral  neuropathy     Symptoms of  . Varicose veins   . Dizziness     chronic   Past Surgical History  Procedure Date  . Tonsillectomy   . Abdominal hysterectomy     Fibroids, ovaries intact, age 64  . Bladder repair     With hyst  . Appendectomy   . Cataract extraction     Implant  . Dexa 06/2000    Slightly improved osteopenia  . Spine surgery 07/2000    Compound fracture sping - T9, L1  . Fracture surgery     L2, L3 compoud fracture in the past  . Cardiolite 08/2001    Neg EF 65%  . Dexa 01/2003    OP at femeral neck  . Colonoscopy 02/2004    Divertics, hems  . Dexa 01/2005    Osteopenia at femoral neck  . Foot surgery 2007  . Fetal blood transfusion 10/2005  . Esophagogastroduodenoscopy 11/2005    Gastic ulcer  . Esophagogastroduodenoscopy 02/2006    Schatzki ring, HH, mucosal abnormalities, Bx H pylori  . Esophagogastroduodenoscopy 01/2008    Schatzki ring, HH    Family History  Problem Relation Age of Onset  . Stroke Father   . Heart attack Brother     MI  . Heart attack Brother     MI  . Heart attack Brother  MI  . Colon cancer Other     Grandfather   Allergies  Allergen Reactions  . Alprazolam     REACTION: headaches  . Calcitonin ([place])     REACTION: dizzy  . Ibandronate Sodium     REACTION: dysphagia  . Raloxifene     REACTION: cramps   Current Outpatient Prescriptions on File Prior to Visit  Medication Sig Dispense Refill  . acetaminophen (TYLENOL) 650 MG CR tablet Take 1,200 mg by mouth 2 (two) times daily in the am and at bedtime..        . aspirin 81 MG EC tablet Take 81 mg by mouth daily.        Marland Kitchen CALCIUM-VITAMIN D PO Take by mouth.        . Cholecalciferol (VITAMIN D-400 PO) Take 1 tablet by mouth daily.       Marland Kitchen DAILY MULTIPLE VITAMINS PO Take 1 tablet by mouth daily.        Marland Kitchen denosumab (PROLIA) 60 MG/ML SOLN Inject 60 mg into the skin every 6 (six) months.        . docusate sodium (COLACE) 100 MG capsule Take 100 mg by mouth 2  (two) times daily.        . Ensure Plus (ENSURE PLUS) LIQD Take 1 Can by mouth 2 (two) times daily.        . ergocalciferol (VITAMIN D2) 50000 UNITS capsule Take 50,000 Units by mouth every 30 (thirty) days.        . fentaNYL (DURAGESIC - DOSED MCG/HR) 25 MCG/HR Place 1 patch (25 mcg total) onto the skin every 3 (three) days.  10 patch  0  . hydrocodone-acetaminophen (LORCET-HD) 5-500 MG per capsule Take 1/2 tablet by mouth up to every 6 hours during the day as needed for pain. *WATCH FOR SEDATION*  60 capsule  0  . Ibandronate Sodium (BONIVA) 2.5 MG TABS Take 1 tablet by mouth every 30 (thirty) days.        Marland Kitchen levothyroxine (SYNTHROID, LEVOTHROID) 25 MCG tablet Take 25 mcg by mouth daily.        Marland Kitchen loratadine (CLARITIN) 10 MG tablet Take 10 mg by mouth daily.        Marland Kitchen LORazepam (ATIVAN) 0.5 MG tablet Take 0.5 mg by mouth at bedtime as needed.        . meclizine (ANTIVERT) 12.5 MG tablet Take 1 tablet (12.5 mg total) by mouth 3 (three) times daily as needed for dizziness or nausea.  30 tablet  1  . mirtazapine (REMERON) 7.5 MG tablet Take 7.5 mg by mouth at bedtime.        . Nutritional Supplements (RESOURCE ARGINAID) PACK Mix one packet in 8oz of water by mouth twice a day       . omeprazole (PRILOSEC) 20 MG capsule Take 20 mg by mouth daily.        . ondansetron (ZOFRAN) 4 MG tablet Take 4 mg by mouth 3 (three) times daily.        Bernadette Hoit Sodium (SENNA PLUS PO) Take 2 tablets by mouth at bedtime.          The PMH, PSH, Social History, Family History, Medications, and allergies have been reviewed in [facility], and have been updated if relevant.   PHYSICAL EXAMINATION:  BP 110/80  Pulse 60  Temp(Src) 98.6 F (37 C) (Oral)  Wt 91 lb (41.277 kg)  GENERAL: She is alert and oriented, in no apparent distress,  cooperating fairly, chronically  ill appearing and fragile.  HEENT: Extraocular movements intact. Pupils equal to light and  reactive to light. Oropharynx clear. Mucous membranes  moist.  NECK: No JVD. No carotid bruits.  CARDIAC: Regular rate and rhythm without murmurs, rubs, or gallops.  CHEST: Clear to auscultation bilaterally. No wheeze, rhonchi, or  rales.  ABDOMEN: Soft, nontender, nondistended. Positive bowel sounds. No  hepatosplenomegaly.  EXTREMITIES: No clubbing, cyanosis, or edema.  +stage 2 pressure ulcer on left ankle. PSYCH: Normal affect. No focal neurological deficits.    1. VERTEBRAL FRACTURE, THORACIC SPINE   >25 min spent with face to face with patient counseling and coordinating care. Pain appears well controlled. My biggest concern is her weight. Agreed to try Marinol, rx given. 2. DEGENERATIVE DISC DISEASE, LUMBAR SPINE  denosumab (PROLIA) injection 60 mg  3. Decubitus ulcer   New.  Check labs today, including albumin.  Reiterated importance of increasing her weight.   4. Vertigo   Improved.  Check other labs as well to rule out other possible contributing factors. CBC w/Diff, TSH  5. Failure to thrive in adult   Deteriorated.  Increased remeron to help with mood as well.  Will also contact the [facility] and ask them to try other nutrition supplements. Hepatic function panel, Basic Metabolic Panel (BMET)

## 2010-12-04 NOTE — Patient Instructions (Signed)
Good to see you. Please try your nutritional supplements.

## 2010-12-05 LAB — HEPATIC FUNCTION PANEL
ALT: 11 U/L (ref 0–35)
AST: 19 U/L (ref 0–37)
Albumin: 3.2 g/dL — ABNORMAL LOW (ref 3.5–5.2)
Total Protein: 6.2 g/dL (ref 6.0–8.3)

## 2010-12-05 LAB — CBC WITH DIFFERENTIAL/PLATELET
Basophils Absolute: 0 10*3/uL (ref 0.0–0.1)
Basophils Relative: 1 % (ref 0–1)
Eosinophils Absolute: 0.2 10*3/uL (ref 0.0–0.7)
Eosinophils Relative: 3 % (ref 0–5)
HCT: 40.9 % (ref 36.0–46.0)
MCHC: 32 g/dL (ref 30.0–36.0)
MCV: 93.2 fL (ref 78.0–100.0)
Monocytes Absolute: 0.5 10*3/uL (ref 0.1–1.0)
Platelets: 168 10*3/uL (ref 150–400)
RDW: 14.7 % (ref 11.5–15.5)

## 2010-12-05 LAB — BASIC METABOLIC PANEL
Calcium: 9 mg/dL (ref 8.4–10.5)
Glucose, Bld: 81 mg/dL (ref 70–99)
Sodium: 138 mEq/L (ref 135–145)

## 2010-12-07 ENCOUNTER — Encounter: Payer: Self-pay | Admitting: Family Medicine

## 2010-12-08 ENCOUNTER — Encounter: Payer: Self-pay | Admitting: Family Medicine

## 2010-12-16 DIAGNOSIS — L8992 Pressure ulcer of unspecified site, stage 2: Secondary | ICD-10-CM

## 2010-12-16 DIAGNOSIS — R627 Adult failure to thrive: Secondary | ICD-10-CM

## 2010-12-16 DIAGNOSIS — L97309 Non-pressure chronic ulcer of unspecified ankle with unspecified severity: Secondary | ICD-10-CM

## 2010-12-16 DIAGNOSIS — I2510 Atherosclerotic heart disease of native coronary artery without angina pectoris: Secondary | ICD-10-CM

## 2010-12-29 ENCOUNTER — Other Ambulatory Visit: Payer: Self-pay | Admitting: *Deleted

## 2010-12-29 MED ORDER — FENTANYL 25 MCG/HR TD PT72: 1.0000 | MEDICATED_PATCH | TRANSDERMAL | Status: DC

## 2010-12-29 NOTE — Telephone Encounter (Signed)
Please call Rayfield Citizen at the [facility] when ready.

## 2010-12-30 NOTE — Telephone Encounter (Signed)
Spoke with Rayfield Citizen via telephone and advised Rx is ready for pick up will be left at front desk.

## 2010-12-30 NOTE — Telephone Encounter (Signed)
Called the [facility] twice this morning and continue to get the voicemail.  Will call back later.

## 2010-12-31 ENCOUNTER — Other Ambulatory Visit: Payer: Self-pay | Admitting: Family Medicine

## 2010-12-31 MED ORDER — FENTANYL 25 MCG/HR TD PT72: 1.0000 | MEDICATED_PATCH | TRANSDERMAL | Status: DC

## 2011-01-06 ENCOUNTER — Encounter: Payer: Self-pay | Admitting: Family Medicine

## 2011-01-15 ENCOUNTER — Other Ambulatory Visit: Payer: Self-pay | Admitting: *Deleted

## 2011-01-15 MED ORDER — MECLIZINE HCL 12.5 MG PO TABS: 12.5000 mg | ORAL_TABLET | Freq: Three times a day (TID) | ORAL | Status: DC | PRN

## 2011-01-15 NOTE — Telephone Encounter (Signed)
Received fax requesting a refill on Meclizine 12.5mg .  Written refill order should be faxed to [phone] and [phone].

## 2011-01-15 NOTE — Telephone Encounter (Signed)
Order signed and faxed to both numbers as instructed.

## 2011-01-26 ENCOUNTER — Other Ambulatory Visit: Payer: Self-pay | Admitting: *Deleted

## 2011-01-26 MED ORDER — FENTANYL 25 MCG/HR TD PT72: 1.0000 | MEDICATED_PATCH | TRANSDERMAL | Status: DC

## 2011-01-26 NOTE — Telephone Encounter (Signed)
Original Rx did not print, reprinted Rx and put in Dr. Elmer Sow IN box for her signature when she returns tomorrow.

## 2011-01-26 NOTE — Telephone Encounter (Signed)
Patient needs a written prescription. Please call when ready for pickup. Tiffany Austin is aware that Dr. Dayton Martes is out of the office today. Tiffany Austin stated that this is fine because she is calling ahead for the script.

## 2011-01-27 ENCOUNTER — Telehealth: Payer: Self-pay | Admitting: *Deleted

## 2011-01-27 NOTE — Telephone Encounter (Signed)
Called [facility] again and got no answer, voicemail again but unable to leave a message.  Will call again later.

## 2011-01-27 NOTE — Telephone Encounter (Signed)
Received a fax from [facility] regarding Omeprazole.  Form in your IN box.

## 2011-01-27 NOTE — Telephone Encounter (Signed)
Called [facility] twice and got no answer, voicemail picks up.  Will call again later.

## 2011-01-28 NOTE — Telephone Encounter (Signed)
Form faxed to [phone].

## 2011-01-28 NOTE — Telephone Encounter (Signed)
Left a message at [facility] for [place] advising that the Rx is ready for pick up and will be left at front desk.

## 2011-02-01 ENCOUNTER — Other Ambulatory Visit: Payer: Self-pay | Admitting: *Deleted

## 2011-02-01 NOTE — Telephone Encounter (Signed)
Received faxed from [facility] requesting a refill.  Fax in your IN box.

## 2011-02-02 MED ORDER — MECLIZINE HCL 12.5 MG PO TABS: 12.5000 mg | ORAL_TABLET | Freq: Three times a day (TID) | ORAL | Status: DC | PRN

## 2011-02-02 MED ORDER — MECLIZINE HCL 12.5 MG PO TABS: 12.5000 mg | ORAL_TABLET | Freq: Three times a day (TID) | ORAL | Status: AC | PRN

## 2011-02-02 NOTE — Telephone Encounter (Signed)
Rx faxed to [facility] pharmacy.

## 2011-02-05 ENCOUNTER — Encounter: Payer: Self-pay | Admitting: Family Medicine

## 2011-02-09 DIAGNOSIS — L97309 Non-pressure chronic ulcer of unspecified ankle with unspecified severity: Secondary | ICD-10-CM

## 2011-02-09 DIAGNOSIS — L8992 Pressure ulcer of unspecified site, stage 2: Secondary | ICD-10-CM

## 2011-02-09 DIAGNOSIS — I2510 Atherosclerotic heart disease of native coronary artery without angina pectoris: Secondary | ICD-10-CM

## 2011-02-09 DIAGNOSIS — R627 Adult failure to thrive: Secondary | ICD-10-CM

## 2011-02-12 ENCOUNTER — Ambulatory Visit (INDEPENDENT_AMBULATORY_CARE_PROVIDER_SITE_OTHER): Payer: 59 | Admitting: *Deleted

## 2011-02-12 DIAGNOSIS — Z23 Encounter for immunization: Secondary | ICD-10-CM

## 2011-02-12 NOTE — Progress Notes (Signed)
Flu shot given during nurse visit today.

## 2011-02-17 DIAGNOSIS — L8992 Pressure ulcer of unspecified site, stage 2: Secondary | ICD-10-CM

## 2011-02-17 DIAGNOSIS — R627 Adult failure to thrive: Secondary | ICD-10-CM

## 2011-02-17 DIAGNOSIS — L97309 Non-pressure chronic ulcer of unspecified ankle with unspecified severity: Secondary | ICD-10-CM

## 2011-02-17 DIAGNOSIS — I2510 Atherosclerotic heart disease of native coronary artery without angina pectoris: Secondary | ICD-10-CM

## 2011-02-20 ENCOUNTER — Emergency Department (HOSPITAL_COMMUNITY): Payer: Medicare Other

## 2011-02-20 ENCOUNTER — Inpatient Hospital Stay (HOSPITAL_COMMUNITY)
Admission: EM | Admit: 2011-02-20 | Discharge: 2011-02-25 | DRG: 964 | Disposition: A | Discharge status: Skilled Nursing Facility | Payer: Medicare Other | Attending: General Surgery | Admitting: General Surgery

## 2011-02-20 ENCOUNTER — Other Ambulatory Visit: Payer: Self-pay

## 2011-02-20 ENCOUNTER — Encounter (HOSPITAL_COMMUNITY): Payer: Self-pay

## 2011-02-20 ENCOUNTER — Emergency Department: Payer: Self-pay | Admitting: Emergency Medicine

## 2011-02-20 DIAGNOSIS — D696 Thrombocytopenia, unspecified: Secondary | ICD-10-CM | POA: Diagnosis present

## 2011-02-20 DIAGNOSIS — S72114A Nondisplaced fracture of greater trochanter of right femur, initial encounter for closed fracture: Secondary | ICD-10-CM | POA: Diagnosis present

## 2011-02-20 DIAGNOSIS — I447 Left bundle-branch block, unspecified: Secondary | ICD-10-CM

## 2011-02-20 DIAGNOSIS — S065X9A Traumatic subdural hemorrhage with loss of consciousness of unspecified duration, initial encounter: Secondary | ICD-10-CM | POA: Diagnosis present

## 2011-02-20 DIAGNOSIS — W06XXXA Fall from bed, initial encounter: Secondary | ICD-10-CM | POA: Diagnosis present

## 2011-02-20 DIAGNOSIS — F04 Amnestic disorder due to known physiological condition: Secondary | ICD-10-CM | POA: Diagnosis present

## 2011-02-20 DIAGNOSIS — S7290XA Unspecified fracture of unspecified femur, initial encounter for closed fracture: Secondary | ICD-10-CM

## 2011-02-20 DIAGNOSIS — K219 Gastro-esophageal reflux disease without esophagitis: Secondary | ICD-10-CM | POA: Diagnosis present

## 2011-02-20 DIAGNOSIS — M51379 Other intervertebral disc degeneration, lumbosacral region without mention of lumbar back pain or lower extremity pain: Secondary | ICD-10-CM | POA: Diagnosis present

## 2011-02-20 DIAGNOSIS — Z681 Body mass index (BMI) 19 or less, adult: Secondary | ICD-10-CM

## 2011-02-20 DIAGNOSIS — S065XAA Traumatic subdural hemorrhage with loss of consciousness status unknown, initial encounter: Principal | ICD-10-CM | POA: Diagnosis present

## 2011-02-20 DIAGNOSIS — M5137 Other intervertebral disc degeneration, lumbosacral region: Secondary | ICD-10-CM

## 2011-02-20 DIAGNOSIS — M810 Age-related osteoporosis without current pathological fracture: Secondary | ICD-10-CM | POA: Diagnosis present

## 2011-02-20 DIAGNOSIS — E039 Hypothyroidism, unspecified: Secondary | ICD-10-CM | POA: Diagnosis present

## 2011-02-20 DIAGNOSIS — S72109A Unspecified trochanteric fracture of unspecified femur, initial encounter for closed fracture: Secondary | ICD-10-CM | POA: Diagnosis present

## 2011-02-20 DIAGNOSIS — R64 Cachexia: Secondary | ICD-10-CM | POA: Diagnosis present

## 2011-02-20 DIAGNOSIS — W19XXXA Unspecified fall, initial encounter: Secondary | ICD-10-CM | POA: Diagnosis present

## 2011-02-20 DIAGNOSIS — S06339A Contusion and laceration of cerebrum, unspecified, with loss of consciousness of unspecified duration, initial encounter: Secondary | ICD-10-CM

## 2011-02-20 DIAGNOSIS — R627 Adult failure to thrive: Secondary | ICD-10-CM | POA: Diagnosis present

## 2011-02-20 DIAGNOSIS — S066X9A Traumatic subarachnoid hemorrhage with loss of consciousness of unspecified duration, initial encounter: Secondary | ICD-10-CM

## 2011-02-20 DIAGNOSIS — Y9210 Unspecified residential institution as the place of occurrence of the external cause: Secondary | ICD-10-CM | POA: Diagnosis present

## 2011-02-20 LAB — POCT I-STAT, CHEM 8
BUN: 21 mg/dL (ref 6–23)
Calcium, Ion: 1.1 mmol/L — ABNORMAL LOW (ref 1.12–1.32)
Chloride: 104 mEq/L (ref 96–112)
HCT: 40 % (ref 36.0–46.0)
Sodium: 139 mEq/L (ref 135–145)
TCO2: 26 mmol/L (ref 0–100)

## 2011-02-20 LAB — COMPREHENSIVE METABOLIC PANEL
ALT: 15 U/L (ref 0–35)
Calcium: 8.6 mg/dL (ref 8.4–10.5)
Creatinine, Ser: 0.77 mg/dL (ref 0.50–1.10)
GFR calc Af Amer: 87 mL/min — ABNORMAL LOW (ref >90)
Glucose, Bld: 145 mg/dL — ABNORMAL HIGH (ref 70–99)
Sodium: 137 mEq/L (ref 135–145)
Total Protein: 6.6 g/dL (ref 6.0–8.3)

## 2011-02-20 LAB — URINALYSIS, MICROSCOPIC ONLY
Glucose, UA: NEGATIVE mg/dL
Leukocytes, UA: NEGATIVE
Specific Gravity, Urine: >1.046 — ABNORMAL HIGH (ref 1.005–1.030)
pH: 8.5 — ABNORMAL HIGH (ref 5.0–8.0)

## 2011-02-20 LAB — CBC
HCT: 38.9 % (ref 36.0–46.0)
Hemoglobin: 12.7 g/dL (ref 12.0–15.0)
MCHC: 32.6 g/dL (ref 30.0–36.0)
RBC: 4.24 MIL/uL (ref 3.87–5.11)
WBC: 12 10*3/uL — ABNORMAL HIGH (ref 4.0–10.5)

## 2011-02-20 LAB — PROTIME-INR: INR: 1.05 (ref 0.00–1.49)

## 2011-02-20 LAB — LACTIC ACID, PLASMA: Lactic Acid, Venous: 2 mmol/L (ref 0.5–2.2)

## 2011-02-20 LAB — SAMPLE TO BLOOD BANK

## 2011-02-20 MED ORDER — SENNOSIDES-DOCUSATE SODIUM 8.6-50 MG PO TABS
2.0000 | ORAL_TABLET | Freq: Every day | ORAL | Status: DC
  Administered 2011-02-20 – 2011-02-24 (×5): 2 via ORAL
  Filled 2011-02-20 (×6): qty 2

## 2011-02-20 MED ORDER — MECLIZINE HCL 12.5 MG PO TABS
12.5000 mg | ORAL_TABLET | Freq: Three times a day (TID) | ORAL | Status: DC | PRN
  Filled 2011-02-20: qty 1

## 2011-02-20 MED ORDER — IOHEXOL 300 MG/ML  SOLN
100.0000 mL | Freq: Once | INTRAMUSCULAR | Status: AC | PRN
  Administered 2011-02-20: 100 mL via INTRAVENOUS

## 2011-02-20 MED ORDER — FAMOTIDINE 20 MG PO TABS
20.0000 mg | ORAL_TABLET | Freq: Two times a day (BID) | ORAL | Status: DC
  Administered 2011-02-20 – 2011-02-25 (×9): 20 mg via ORAL
  Filled 2011-02-20 (×11): qty 1

## 2011-02-20 MED ORDER — VITAMIN D (ERGOCALCIFEROL) 1.25 MG (50000 UNIT) PO CAPS
50000.0000 [IU] | ORAL_CAPSULE | ORAL | Status: DC
  Filled 2011-02-20: qty 1

## 2011-02-20 MED ORDER — DENOSUMAB 60 MG/ML ~~LOC~~ SOLN: 60.0000 mg | SUBCUTANEOUS | Status: DC

## 2011-02-20 MED ORDER — DRONABINOL 2.5 MG PO CAPS
2.5000 mg | ORAL_CAPSULE | Freq: Two times a day (BID) | ORAL | Status: DC
  Administered 2011-02-22 – 2011-02-25 (×6): 2.5 mg via ORAL
  Filled 2011-02-20 (×7): qty 1

## 2011-02-20 MED ORDER — MORPHINE SULFATE 4 MG/ML IJ SOLN
4.0000 mg | Freq: Once | INTRAMUSCULAR | Status: DC
  Filled 2011-02-20: qty 1

## 2011-02-20 MED ORDER — LORAZEPAM 0.5 MG PO TABS
0.5000 mg | ORAL_TABLET | Freq: Every day | ORAL | Status: DC
  Administered 2011-02-21 – 2011-02-24 (×5): 0.5 mg via ORAL
  Filled 2011-02-20 (×6): qty 1

## 2011-02-20 MED ORDER — PANTOPRAZOLE SODIUM 40 MG PO TBEC
40.0000 mg | DELAYED_RELEASE_TABLET | Freq: Every day | ORAL | Status: DC
  Administered 2011-02-20 – 2011-02-24 (×2): 40 mg via ORAL
  Filled 2011-02-20 (×3): qty 1

## 2011-02-20 MED ORDER — HYDROCODONE-ACETAMINOPHEN 5-325 MG PO TABS: 2.0000 | ORAL_TABLET | ORAL | Status: DC | PRN

## 2011-02-20 MED ORDER — CALCIUM CARBONATE-VITAMIN D 500-200 MG-UNIT PO TABS
1.0000 | ORAL_TABLET | Freq: Every day | ORAL | Status: DC
  Administered 2011-02-22 – 2011-02-25 (×4): 1 via ORAL
  Filled 2011-02-20 (×6): qty 1

## 2011-02-20 MED ORDER — HYDROCODONE-ACETAMINOPHEN 5-325 MG PO TABS: 1.0000 | ORAL_TABLET | ORAL | Status: DC | PRN

## 2011-02-20 MED ORDER — MORPHINE SULFATE 2 MG/ML IJ SOLN: 2.0000 mg | INTRAMUSCULAR | Status: DC | PRN

## 2011-02-20 MED ORDER — HYDROCODONE-ACETAMINOPHEN 5-325 MG PO TABS
0.5000 | ORAL_TABLET | ORAL | Status: DC | PRN
  Administered 2011-02-24: 0.5 via ORAL
  Filled 2011-02-20: qty 1

## 2011-02-20 MED ORDER — IBANDRONATE SODIUM 2.5 MG PO TABS
1.0000 | ORAL_TABLET | ORAL | Status: DC
Start: 2011-02-20 — End: 2011-02-20

## 2011-02-20 MED ORDER — DOCUSATE SODIUM 100 MG PO CAPS
100.0000 mg | ORAL_CAPSULE | Freq: Two times a day (BID) | ORAL | Status: DC
  Administered 2011-02-20 – 2011-02-25 (×7): 100 mg via ORAL
  Filled 2011-02-20 (×12): qty 1

## 2011-02-20 MED ORDER — FENTANYL 25 MCG/HR TD PT72
25.0000 ug | MEDICATED_PATCH | TRANSDERMAL | Status: DC
  Administered 2011-02-20 – 2011-02-23 (×2): 25 ug via TRANSDERMAL
  Filled 2011-02-20 (×2): qty 1

## 2011-02-20 MED ORDER — LEVOTHYROXINE SODIUM 25 MCG PO TABS
25.0000 ug | ORAL_TABLET | Freq: Every day | ORAL | Status: DC
  Administered 2011-02-22 – 2011-02-25 (×4): 25 ug via ORAL
  Filled 2011-02-20 (×6): qty 1

## 2011-02-20 MED ORDER — ENSURE PLUS PO LIQD
1.0000 | Freq: Two times a day (BID) | ORAL | Status: DC
  Administered 2011-02-20: 1 via ORAL
  Administered 2011-02-21: 22:00:00 via ORAL
  Administered 2011-02-22 – 2011-02-24 (×5): 1 via ORAL
  Filled 2011-02-20 (×10): qty 237

## 2011-02-20 MED ORDER — SODIUM CHLORIDE 0.9 % IV SOLN
Freq: Once | INTRAVENOUS | Status: AC
  Administered 2011-02-20: 20 mL via INTRAVENOUS

## 2011-02-20 MED ORDER — MIRTAZAPINE 15 MG PO TABS
15.0000 mg | ORAL_TABLET | Freq: Every day | ORAL | Status: DC
  Administered 2011-02-20 – 2011-02-24 (×5): 15 mg via ORAL
  Filled 2011-02-20 (×6): qty 1

## 2011-02-20 MED ORDER — LORATADINE 10 MG PO TABS
10.0000 mg | ORAL_TABLET | Freq: Every day | ORAL | Status: DC | PRN
  Filled 2011-02-20: qty 1

## 2011-02-20 MED ORDER — THERA M PLUS PO TABS
1.0000 | ORAL_TABLET | Freq: Every day | ORAL | Status: DC
  Administered 2011-02-22 – 2011-02-25 (×4): 1 via ORAL
  Filled 2011-02-20 (×6): qty 1

## 2011-02-20 MED ORDER — DEXTROSE-NACL 5-0.9 % IV SOLN
INTRAVENOUS | Status: DC
  Administered 2011-02-20 – 2011-02-21 (×2): via INTRAVENOUS

## 2011-02-20 MED ORDER — ONDANSETRON HCL 4 MG PO TABS: 4.0000 mg | ORAL_TABLET | Freq: Three times a day (TID) | ORAL | Status: DC | PRN

## 2011-02-20 NOTE — ED Notes (Signed)
3113-01 READY

## 2011-02-20 NOTE — Consult Note (Signed)
Reason for Consult: Right Hip Greater Trochanteric Avulsion Fracture Referring Physician: Preston Fleeting MD (ER)  Tiffany Austin is an 77 y.o. female.  HPI: 77 yo female sent over from [facility] in [place] due to a mechanical fall in which she sustain a closed head injury.  She complained on right hip pain and difficulty with ambulating on her right leg as well after the fall.  CT scan showed a fracture of her right greater troch.  Ortho was consulted.  She is seen in the ER and does complain of right hip pain.  Past Medical History  Diagnosis Date  . GERD (gastroesophageal reflux disease)   . Osteoporosis   . Peptic ulcer disease   . Anemia of chronic disease   . Iron deficiency   . Peripheral neuropathy     Symptoms of  . Varicose veins   . Dizziness     chronic    Past Surgical History  Procedure Date  . Tonsillectomy   . Abdominal hysterectomy     Fibroids, ovaries intact, age 22  . Bladder repair     With hyst  . Appendectomy   . Cataract extraction     Implant  . Dexa 06/2000    Slightly improved osteopenia  . Spine surgery 07/2000    Compound fracture sping - T9, L1  . Fracture surgery     L2, L3 compoud fracture in the past  . Cardiolite 08/2001    Neg EF 65%  . Dexa 01/2003    OP at femeral neck  . Colonoscopy 02/2004    Divertics, hems  . Dexa 01/2005    Osteopenia at femoral neck  . Foot surgery 2007  . Fetal blood transfusion 10/2005  . Esophagogastroduodenoscopy 11/2005    Gastic ulcer  . Esophagogastroduodenoscopy 02/2006    Schatzki ring, HH, mucosal abnormalities, Bx H pylori  . Esophagogastroduodenoscopy 01/2008    Schatzki ring, HH    Family History  Problem Relation Age of Onset  . Stroke Father   . Heart attack Brother     MI  . Heart attack Brother     MI  . Heart attack Brother     MI  . Colon cancer Other     Grandfather    Social History:  reports that she has never smoked. She does not have any smokeless tobacco history on  file. Her alcohol and drug histories not on file.  Allergies:  Allergies  Allergen Reactions  . Alprazolam     REACTION: headaches  . Calcitonin ([place])     REACTION: dizzy  . Ibandronate Sodium     REACTION: dysphagia  . Raloxifene     REACTION: cramps    Medications: I have reviewed the patient's current medications.  Results for orders placed during the hospital encounter of 02/20/11 (from the past 48 hour(s))  COMPREHENSIVE METABOLIC PANEL     Status: Abnormal   Collection Time   02/20/11  9:03 AM      Component Value Range Comment   Sodium 137  135 - 145 (mEq/L)    Potassium 4.1  3.5 - 5.1 (mEq/L)    Chloride 101  96 - 112 (mEq/L)    CO2 27  19 - 32 (mEq/L)    Glucose, Bld 145 (*) 70 - 99 (mg/dL)    BUN 21  6 - 23 (mg/dL)    Creatinine, Ser 1.61  0.50 - 1.10 (mg/dL)    Calcium 8.6  8.4 - 10.5 (mg/dL)  Total Protein 6.6  6.0 - 8.3 (g/dL)    Albumin 3.1 (*) 3.5 - 5.2 (g/dL)    AST 22  0 - 37 (U/L)    ALT 15  0 - 35 (U/L)    Alkaline Phosphatase 79  39 - 117 (U/L)    Total Bilirubin 0.4  0.3 - 1.2 (mg/dL)    GFR calc non Af Amer 75 (*) >90 (mL/min)    GFR calc Af Amer 87 (*) >90 (mL/min)   CBC     Status: Abnormal   Collection Time   02/20/11  9:03 AM      Component Value Range Comment   WBC 12.0 (*) 4.0 - 10.5 (K/uL)    RBC 4.24  3.87 - 5.11 (MIL/uL)    Hemoglobin 12.7  12.0 - 15.0 (g/dL)    HCT 16.1  09.6 - 04.5 (%)    MCV 91.7  78.0 - 100.0 (fL)    MCH 30.0  26.0 - 34.0 (pg)    MCHC 32.6  30.0 - 36.0 (g/dL)    RDW 40.9  81.1 - 91.4 (%)    Platelets 118 (*) 150 - 400 (K/uL) PLATELET COUNT CONFIRMED BY SMEAR  LACTIC ACID, PLASMA     Status: Normal   Collection Time   02/20/11  9:03 AM      Component Value Range Comment   Lactic Acid, Venous 2.0  0.5 - 2.2 (mmol/L)   PROTIME-INR     Status: Normal   Collection Time   02/20/11  9:03 AM      Component Value Range Comment   Prothrombin Time 13.9  11.6 - 15.2 (seconds)    INR 1.05  0.00 - 1.49    SAMPLE TO  BLOOD BANK     Status: Normal   Collection Time   02/20/11  9:03 AM      Component Value Range Comment   Blood Bank Specimen SAMPLE AVAILABLE FOR TESTING      Sample Expiration 02/21/2011     POCT I-STAT, CHEM 8     Status: Abnormal   Collection Time   02/20/11  9:16 AM      Component Value Range Comment   Sodium 139  135 - 145 (mEq/L)    Potassium 4.2  3.5 - 5.1 (mEq/L)    Chloride 104  96 - 112 (mEq/L)    BUN 21  6 - 23 (mg/dL)    Creatinine, Ser 7.82  0.50 - 1.10 (mg/dL)    Glucose, Bld 956 (*) 70 - 99 (mg/dL)    Calcium, Ion 2.13 (*) 1.12 - 1.32 (mmol/L)    TCO2 26  0 - 100 (mmol/L)    Hemoglobin 13.6  12.0 - 15.0 (g/dL)    HCT 08.6  57.8 - 46.9 (%)   URINALYSIS, MICROSCOPIC ONLY     Status: Abnormal   Collection Time   02/20/11 11:48 AM      Component Value Range Comment   Color, Urine YELLOW  YELLOW     APPearance CLEAR  CLEAR     Specific Gravity, Urine >1.046 (*) 1.005 - 1.030     pH 8.5 (*) 5.0 - 8.0     Glucose, UA NEGATIVE  NEGATIVE (mg/dL)    Hgb urine dipstick MODERATE (*) NEGATIVE     Bilirubin Urine NEGATIVE  NEGATIVE     Ketones, ur NEGATIVE  NEGATIVE (mg/dL)    Protein, ur NEGATIVE  NEGATIVE (mg/dL)    Urobilinogen, UA 0.2  0.0 - 1.0 (  mg/dL)    Nitrite NEGATIVE  NEGATIVE     Leukocytes, UA NEGATIVE  NEGATIVE     RBC / HPF 3-6  <3 (RBC/hpf)     Ct Abdomen Pelvis W Contrast  02/20/2011  *RADIOLOGY REPORT*  Clinical Data: Trauma.  Fall.  Acute versus chronic hip injury. Low back and groin pain.  CT ABDOMEN AND PELVIS WITH CONTRAST  Technique:  Multidetector CT imaging of the abdomen and pelvis was performed following the standard protocol during bolus administration of intravenous contrast.  Contrast: OMNIPAQUE IOHEXOL 300 MG/ML IV SOLN  Comparison: 11/28/2008.  Findings: Atelectasis and nonspecific interstitial changes are present at the lung bases.  Visualized portions of the heart appear within normal limits.  The liver and gallbladder unremarkable. Adrenal  glands normal.  Normal renal enhancement and delayed excretion of contrast.  Markedly tortuous abdominal aorta with kinking at the level of the renal arteries.  Maximal diameter infrarenal abdominal aorta is 22 mm.  Foley catheter present in the urinary bladder with iatrogenic air.  No free fluid.  Colonic diverticulosis without diverticulitis.  Small bowel appears within normal limits.  Healed bilateral obturator ring fractures are present.  Diffuse osteopenia.  Sclerosis of the left sacral ala compatible with healed insufficiency fracture.  Chronic compression fractures of the thoracolumbar spine appear little changed compared to prior exam.  There is ankylosis of the L2-L3 and L4-L5.  L4 superior endplate compression fracture is unchanged.  No retropulsion.  The L2 superior endplate compression fracture is unchanged with mild retropulsion.  The L1 compression fracture appears similar as well.  Vertebral augmentation at T11 and T12.  Partially visualized T10 superior endplate compression fracture is likely chronic.  Per CMS PQRS reporting requirements (PQRS Measure 24): Given the patient's age of greater than 50 and the fracture site (hip, distal radius, or spine), the patient should be tested for osteoporosis using DXA, and the appropriate treatment considered based on the DXA results.  There is a right greater trochanteric fracture with minimal extension inferiorly.  The femoral neck appears intact.  Left proximal femur appears intact.  IMPRESSION: 1.  Acute right greater trochanter fracture. Extension of fracture plane into the base of the greater trochanter.  The femoral neck is intact.  In this osteopenic patient, MRI of the right hip may be useful to further assess the extent of intertrochanteric extension. 2.  Healed obturator ring fractures, and left sacral ala fracture. 3.  Chronic multilevel thoracolumbar compression fractures with lower thoracic vertebral augmentation.  4.  Atherosclerosis with markedly  tortuous abdominal aorta.  Original Report Authenticated By: Andreas Newport, M.D.   Dg Chest Portable 1 View  02/20/2011  *RADIOLOGY REPORT*  Clinical Data: Syncope.  Fall.  Weakness.  Hypertension.  PORTABLE CHEST - 1 VIEW  Comparison: 06/13/2010.  Findings: There is no pneumothorax identified.  Cardiopericardial silhouette is within normal limits for projection.  Upper thoracic vertebral augmentation.  No displaced rib fractures.  Emphysema is present with bilateral pleural apical scarring.  Osteopenia.  Lower thoracic vertebral augmentation is new compared to 06/13/2010. 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 Stable calcified granuloma in the left midlung.  IMPRESSION: No acute cardiopulmonary disease.  Probable underlying emphysema and bilateral pleural apical scarring.  Original Report Authenticated By: Andreas Newport, M.D.    ROS Blood pressure 98/56, pulse 89, temperature 98.2 F (36.8 C), temperature source Oral, resp. rate 20, SpO2 96.00%. Physical Exam  Musculoskeletal:       Right hip: She exhibits bony tenderness.  She has pain with attempted right hip motion.  Distally she is neurovascularly intact.  Assessment/Plan: Right hip greater trochanteric avulsion fracture 1) this is a non-operative injury that can be watched for now.  She can get up with physical therapy with WBAT right hip with a walker and no excessive hip abduction.  I'll follow her closely and can order PT later.  Kunta Hilleary Y 02/20/2011, 2:13 PM

## 2011-02-20 NOTE — ED Notes (Signed)
Tiffany Austin - contact in case of an emergency - cell - [phone] Amyla Markuson - [phone]

## 2011-02-20 NOTE — Consult Note (Signed)
Internal Medicine Consult done by Tiffany Massed, MD.  [name] Age: 77 y.o. Sex: female Date of Birth: 09-01-1924 Admit Date: 02/20/2011 [id number] Tiffany Martes, MD, MD Requesting MD: Tiffany Loron, MD, On 02/20/2011    REASON FOR CONSULTATION:  Management of medical co-morbidities Question of Afib  HPI: Patient is 77 year old female who has a Gatti-standing history of severe osteoporosis and multiple compression fractures in the past, history of hypothyroidism, depression, GERD who was transferred from [facility]. Apparently the early this morning patient's patient sustained a fall, she was then taken to [facility] where she was found to have a subdural hematoma and was subsequently transferred to [facility] for further management. She has been admitted by the trauma service, the hospitalist service has been asked to consult and manage patient's medical comorbidities. There was a question of whether patient was in atrial fibrillation earlier this morning, however review of her telemetry strips show that she is in sinus rhythm. -Up on further asking the patient exactly what happened early this morning, she does not recollect how she fell. It is not known whether this was actually a syncopal episode or she sustained a mechanical fall. -She also has suffered a right greater trochanter fracture -She has already been seen by the neurosurgical service, orthopedic service and has been admitted by the trauma  service already. -Patient denies any prior chest pain or shortness of breath. She denies any fever. Patient's daughter and son are present at bedside as well.  ALLERGIES:   Allergies  Allergen Reactions  . Alprazolam     REACTION: headaches  . Calcitonin ([place])     REACTION: dizzy  . Ibandronate Sodium     REACTION: dysphagia  . Raloxifene     REACTION: cramps    PAST MEDICAL HISTORY: Past Medical History  Diagnosis Date  . GERD  (gastroesophageal reflux disease)   . Osteoporosis   . Peptic ulcer disease   . Anemia of chronic disease   . Iron deficiency   . Peripheral neuropathy     Symptoms of  . Varicose veins   . Dizziness     chronic    PAST SURGICAL HISTORY: Past Surgical History  Procedure Date  . Tonsillectomy   . Abdominal hysterectomy     Fibroids, ovaries intact, age 6  . Bladder repair     With hyst  . Appendectomy   . Cataract extraction     Implant  . Dexa 06/2000    Slightly improved osteopenia  . Spine surgery 07/2000    Compound fracture sping - T9, L1  . Fracture surgery     L2, L3 compoud fracture in the past  . Cardiolite 08/2001    Neg EF 65%  . Dexa 01/2003    OP at femeral neck  . Colonoscopy 02/2004    Divertics, hems  . Dexa 01/2005    Osteopenia at femoral neck  . Foot surgery 2007  . Fetal blood transfusion 10/2005  . Esophagogastroduodenoscopy 11/2005    Gastic ulcer  . Esophagogastroduodenoscopy 02/2006    Schatzki ring, HH, mucosal abnormalities, Bx H pylori  . Esophagogastroduodenoscopy 01/2008    Schatzki ring, HH    MEDICATIONS AT HOME: Prior to Admission medications   Medication Sig Start Date End Date Taking? Authorizing Provider  CALCIUM ALGINATE EX Apply 1 application topically daily as needed. With optifoam to left ankle periwound as needed    Yes Historical Provider, MD  Control Gel Formula Dressing (DUODERM  CGF BORDER DRESSING EX) Apply 1 application topically 2 (two) times daily. For ulcer healing    Yes Historical Provider, MD  dronabinol (MARINOL) 2.5 MG capsule Take 2.5 mg by mouth 2 (two) times daily before a meal.     Yes Historical Provider, MD  ergocalciferol (VITAMIN D2) 50000 UNITS capsule Take 50,000 Units by mouth every 30 (thirty) days.     Yes Historical Provider, MD  fentaNYL (DURAGESIC - DOSED MCG/HR) 25 MCG/HR Place 1 patch (25 mcg total) onto the skin every 3 (three) days. 01/26/11  Yes Ruthe Mannan, MD  hydrocodone-acetaminophen  (LORCET-HD) 5-500 MG per capsule Take 1/2 tablet by mouth up to every 6 hours during the day as needed for pain. *WATCH FOR SEDATION* 06/05/10  Yes Eustaquio Boyden, MD  levothyroxine (SYNTHROID, LEVOTHROID) 25 MCG tablet Take 25 mcg by mouth daily.     Yes Historical Provider, MD  loratadine (CLARITIN) 10 MG tablet Take 10 mg by mouth daily as needed. For allergies   Yes Historical Provider, MD  meclizine (ANTIVERT) 12.5 MG tablet Take 1 tablet (12.5 mg total) by mouth 3 (three) times daily as needed for dizziness or nausea. 02/02/11 02/02/12 Yes Ruthe Mannan, MD  mirtazapine (REMERON) 15 MG tablet Take 15 mg by mouth at bedtime.   12/04/10  Yes Ruthe Mannan, MD  ondansetron (ZOFRAN) 4 MG tablet Take 4 mg by mouth every 8 (eight) hours as needed. nausea   Yes Historical Provider, MD  oxycodone (OXY-IR) 5 MG capsule Take 5 mg by mouth every 6 (six) hours as needed. Breakthrough pain   Yes Historical Provider, MD  ranitidine (ZANTAC) 150 MG tablet Take 150 mg by mouth 2 (two) times daily as needed. Indigestion/heartburn    Yes Historical Provider, MD    FAMILY HISTORY: Family History  Problem Relation Age of Onset  . Stroke Father   . Heart attack Brother     MI  . Heart attack Brother     MI  . Heart attack Brother     MI  . Colon cancer Other     Grandfather    SOCIAL HISTORY:  reports that she has never smoked. She does not have any smokeless tobacco history on file. Her alcohol and drug histories not on file.  REVIEW OF SYSTEMS:  Constitutional:   No  weight loss, night sweats,  Fevers, chills, fatigue.  HEENT:    No headaches, Difficulty swallowing,Tooth/dental problems,Sore throat,  No sneezing, itching, ear ache, nasal congestion, post nasal drip,   Cardio-vascular: No chest pain,  Orthopnea, PND, swelling in lower extremities, anasarca, dizziness, palpitations  GI:  No heartburn, indigestion, abdominal pain, nausea, vomiting, diarrhea, change in bowel habits, loss of  appetite  Resp: No shortness of breath with exertion or at rest.  No excess mucus, no productive cough, No non-productive cough,  No coughing up of blood.No change in color of mucus.No wheezing.No chest wall deformity  Skin:  no rash or lesions.  GU:  no dysuria, change in color of urine, no urgency or frequency.  No flank pain.  Musculoskeletal: No joint pain or swelling.  No decreased range of motion.  No back pain.  Psych: No change in mood or affect. No depression or anxiety.  No memory loss.   PHYSICAL EXAM: Blood pressure 98/56, pulse 89, temperature 98.2 F (36.8 C), temperature source Oral, resp. rate 20, SpO2 96.00%.  General appearance :Awake, alert, not in any distress. Speech Clear. Not toxic Looking. Very cachectic. HEENT: Small bruise above  the left eye area, pupils equally reactive to light and accomodation Neck: supple, no JVD. No cervical lymphadenopathy.  Chest:Good air entry bilaterally, no added sounds  CVS: S1 S2 regular, 2/6 systolic murmur present. Abdomen: Bowel sounds present, Non tender and not distended with no gaurding, rigidity or rebound. Extremities: B/L Lower Ext shows no edema, both legs are warm to touch, with  dorsalis pedis pulses palpable. Neurology: Awake alert, and oriented X 3, CN II-XII intact, Non focal, Deep Tendon Reflex-2+ all over, plantar's downgoing B/L, sensory exam is grossly intact.  Skin:No Rash Wounds:N/A  LABS ON ADMISSION:   Basename 02/20/11 0916 02/20/11 0903  NA 139 137  K 4.2 4.1  CL 104 101  CO2 -- 27  GLUCOSE 148* 145*  BUN 21 21  CREATININE 0.90 0.77  CALCIUM -- 8.6  MG -- --  PHOS -- --    Basename 02/20/11 0903  AST 22  ALT 15  ALKPHOS 79  BILITOT 0.4  PROT 6.6  ALBUMIN 3.1*   No results found for this basename: LIPASE:2,AMYLASE:2 in the last 72 hours  Basename 02/20/11 0916 02/20/11 0903  WBC -- 12.0*  NEUTROABS -- --  HGB 13.6 12.7  HCT 40.0 38.9  MCV -- 91.7  PLT -- 118*   No results  found for this basename: CKTOTAL:3,CKMB:3,CKMBINDEX:3,TROPONINI:3 in the last 72 hours No results found for this basename: DDIMER:2 in the last 72 hours No results found for this basename: POCBNP:3 in the last 72 hours   RADIOLOGIC STUDIES ON ADMISSION: Ct Abdomen Pelvis W Contrast  02/20/2011  *RADIOLOGY REPORT*  Clinical Data: Trauma.  Fall.  Acute versus chronic hip injury. Low back and groin pain.  CT ABDOMEN AND PELVIS WITH CONTRAST  Technique:  Multidetector CT imaging of the abdomen and pelvis was performed following the standard protocol during bolus administration of intravenous contrast.  Contrast: OMNIPAQUE IOHEXOL 300 MG/ML IV SOLN  Comparison: 11/28/2008.  Findings: Atelectasis and nonspecific interstitial changes are present at the lung bases.  Visualized portions of the heart appear within normal limits.  The liver and gallbladder unremarkable. Adrenal glands normal.  Normal renal enhancement and delayed excretion of contrast.  Markedly tortuous abdominal aorta with kinking at the level of the renal arteries.  Maximal diameter infrarenal abdominal aorta is 22 mm.  Foley catheter present in the urinary bladder with iatrogenic air.  No free fluid.  Colonic diverticulosis without diverticulitis.  Small bowel appears within normal limits.  Healed bilateral obturator ring fractures are present.  Diffuse osteopenia.  Sclerosis of the left sacral ala compatible with healed insufficiency fracture.  Chronic compression fractures of the thoracolumbar spine appear little changed compared to prior exam.  There is ankylosis of the L2-L3 and L4-L5.  L4 superior endplate compression fracture is unchanged.  No retropulsion.  The L2 superior endplate compression fracture is unchanged with mild retropulsion.  The L1 compression fracture appears similar as well.  Vertebral augmentation at T11 and T12.  Partially visualized T10 superior endplate compression fracture is likely chronic.  Per CMS PQRS reporting  requirements (PQRS Measure 24): Given the patient's age of greater than 50 and the fracture site (hip, distal radius, or spine), the patient should be tested for osteoporosis using DXA, and the appropriate treatment considered based on the DXA results.  There is a right greater trochanteric fracture with minimal extension inferiorly.  The femoral neck appears intact.  Left proximal femur appears intact.  IMPRESSION: 1.  Acute right greater trochanter fracture. Extension of  fracture plane into the base of the greater trochanter.  The femoral neck is intact.  In this osteopenic patient, MRI of the right hip may be useful to further assess the extent of intertrochanteric extension. 2.  Healed obturator ring fractures, and left sacral ala fracture. 3.  Chronic multilevel thoracolumbar compression fractures with lower thoracic vertebral augmentation.  4.  Atherosclerosis with markedly tortuous abdominal aorta.  Original Report Authenticated By: Andreas Newport, M.D.   Dg Chest Portable 1 View  02/20/2011  *RADIOLOGY REPORT*  Clinical Data: Syncope.  Fall.  Weakness.  Hypertension.  PORTABLE CHEST - 1 VIEW  Comparison: 06/13/2010.  Findings: There is no pneumothorax identified.  Cardiopericardial silhouette is within normal limits for projection.  Upper thoracic vertebral augmentation.  No displaced rib fractures.  Emphysema is present with bilateral pleural apical scarring.  Osteopenia.  Lower thoracic vertebral augmentation is new compared to 06/13/2010. 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 Stable calcified granuloma in the left midlung.  IMPRESSION: No acute cardiopulmonary disease.  Probable underlying emphysema and bilateral pleural apical scarring.  Original Report Authenticated By: Andreas Newport, M.D.    ASSESSMENT AND PLAN: Present on Admission:  .Fall -Not sure whether this was a mechanical fall or a syncopal episode  -Would monitor in telemetry.  -Check a 2-D echo.  -Subdural hematoma and  right femoral fracture will be followed by relevant consulting service.   .Hypothyroidism -Would recommend continuing levothyroxine.  . ? A. Fib: -Don't see any evidence of atrial fibrillation so far. Would check a 12-lead EKG and continue to monitor on telemetry.  Don't have much to add for now, however we will follow along and make recommendations as her clinical situation evolves.   DVT Prophylaxis: Would suggest bilateral SCDs.  Thank you for the consult, we will follow the patient with you in the Hospital.  Total time spent 45 minutes.  Tiffany Austin 02/20/2011, 2:45 PM

## 2011-02-20 NOTE — H&P (Signed)
Agree with note. 

## 2011-02-20 NOTE — Consult Note (Signed)
Reason for Consult:Frontal hemorrhage Referring Physician: ED MD  Tiffany Austin is an 77 y.o. female.  HPI: The patient is an 77 year old female who lives at an assisted living facility. She reportedly fell last evening and went [facility] where a CT scan of the brain was performed. This showed a left low frontal hematoma and she was transferred to [facility] hospital for further evaluation.  Past Medical History  Diagnosis Date  . GERD (gastroesophageal reflux disease)   . Osteoporosis   . Peptic ulcer disease   . Anemia of chronic disease   . Iron deficiency   . Peripheral neuropathy     Symptoms of  . Varicose veins   . Dizziness     chronic    Past Surgical History  Procedure Date  . Tonsillectomy   . Abdominal hysterectomy     Fibroids, ovaries intact, age 16  . Bladder repair     With hyst  . Appendectomy   . Cataract extraction     Implant  . Dexa 06/2000    Slightly improved osteopenia  . Spine surgery 07/2000    Compound fracture sping - T9, L1  . Fracture surgery     L2, L3 compoud fracture in the past  . Cardiolite 08/2001    Neg EF 65%  . Dexa 01/2003    OP at femeral neck  . Colonoscopy 02/2004    Divertics, hems  . Dexa 01/2005    Osteopenia at femoral neck  . Foot surgery 2007  . Fetal blood transfusion 10/2005  . Esophagogastroduodenoscopy 11/2005    Gastic ulcer  . Esophagogastroduodenoscopy 02/2006    Schatzki ring, HH, mucosal abnormalities, Bx H pylori  . Esophagogastroduodenoscopy 01/2008    Schatzki ring, HH    Family History  Problem Relation Age of Onset  . Stroke Father   . Heart attack Brother     MI  . Heart attack Brother     MI  . Heart attack Brother     MI  . Colon cancer Other     Grandfather    Social History:  reports that she has never smoked. She does not have any smokeless tobacco history on file. Her alcohol and drug histories not on file.  Allergies:  Allergies  Allergen Reactions  .  Alprazolam     REACTION: headaches  . Calcitonin ([place])     REACTION: dizzy  . Ibandronate Sodium     REACTION: dysphagia  . Raloxifene     REACTION: cramps    Medications: I have reviewed the patient's current medications.  Results for orders placed during the hospital encounter of 02/20/11 (from the past 48 hour(s))  COMPREHENSIVE METABOLIC PANEL     Status: Abnormal   Collection Time   02/20/11  9:03 AM      Component Value Range Comment   Sodium 137  135 - 145 (mEq/L)    Potassium 4.1  3.5 - 5.1 (mEq/L)    Chloride 101  96 - 112 (mEq/L)    CO2 27  19 - 32 (mEq/L)    Glucose, Bld 145 (*) 70 - 99 (mg/dL)    BUN 21  6 - 23 (mg/dL)    Creatinine, Ser 1.61  0.50 - 1.10 (mg/dL)    Calcium 8.6  8.4 - 10.5 (mg/dL)    Total Protein 6.6  6.0 - 8.3 (g/dL)    Albumin 3.1 (*) 3.5 - 5.2 (g/dL)    AST 22  0 - 37 (  U/L)    ALT 15  0 - 35 (U/L)    Alkaline Phosphatase 79  39 - 117 (U/L)    Total Bilirubin 0.4  0.3 - 1.2 (mg/dL)    GFR calc non Af Amer 75 (*) >90 (mL/min)    GFR calc Af Amer 87 (*) >90 (mL/min)   CBC     Status: Abnormal   Collection Time   02/20/11  9:03 AM      Component Value Range Comment   WBC 12.0 (*) 4.0 - 10.5 (K/uL)    RBC 4.24  3.87 - 5.11 (MIL/uL)    Hemoglobin 12.7  12.0 - 15.0 (g/dL)    HCT 27.2  53.6 - 64.4 (%)    MCV 91.7  78.0 - 100.0 (fL)    MCH 30.0  26.0 - 34.0 (pg)    MCHC 32.6  30.0 - 36.0 (g/dL)    RDW 03.4  74.2 - 59.5 (%)    Platelets 118 (*) 150 - 400 (K/uL) PLATELET COUNT CONFIRMED BY SMEAR  LACTIC ACID, PLASMA     Status: Normal   Collection Time   02/20/11  9:03 AM      Component Value Range Comment   Lactic Acid, Venous 2.0  0.5 - 2.2 (mmol/L)   PROTIME-INR     Status: Normal   Collection Time   02/20/11  9:03 AM      Component Value Range Comment   Prothrombin Time 13.9  11.6 - 15.2 (seconds)    INR 1.05  0.00 - 1.49    SAMPLE TO BLOOD BANK     Status: Normal   Collection Time   02/20/11  9:03 AM      Component Value Range  Comment   Blood Bank Specimen SAMPLE AVAILABLE FOR TESTING      Sample Expiration 02/21/2011     POCT I-STAT, CHEM 8     Status: Abnormal   Collection Time   02/20/11  9:16 AM      Component Value Range Comment   Sodium 139  135 - 145 (mEq/L)    Potassium 4.2  3.5 - 5.1 (mEq/L)    Chloride 104  96 - 112 (mEq/L)    BUN 21  6 - 23 (mg/dL)    Creatinine, Ser 6.38  0.50 - 1.10 (mg/dL)    Glucose, Bld 756 (*) 70 - 99 (mg/dL)    Calcium, Ion 4.33 (*) 1.12 - 1.32 (mmol/L)    TCO2 26  0 - 100 (mmol/L)    Hemoglobin 13.6  12.0 - 15.0 (g/dL)    HCT 29.5  18.8 - 41.6 (%)     Ct Abdomen Pelvis W Contrast  02/20/2011  *RADIOLOGY REPORT*  Clinical Data: Trauma.  Fall.  Acute versus chronic hip injury. Low back and groin pain.  CT ABDOMEN AND PELVIS WITH CONTRAST  Technique:  Multidetector CT imaging of the abdomen and pelvis was performed following the standard protocol during bolus administration of intravenous contrast.  Contrast: OMNIPAQUE IOHEXOL 300 MG/ML IV SOLN  Comparison: 11/28/2008.  Findings: Atelectasis and nonspecific interstitial changes are present at the lung bases.  Visualized portions of the heart appear within normal limits.  The liver and gallbladder unremarkable. Adrenal glands normal.  Normal renal enhancement and delayed excretion of contrast.  Markedly tortuous abdominal aorta with kinking at the level of the renal arteries.  Maximal diameter infrarenal abdominal aorta is 22 mm.  Foley catheter present in the urinary bladder with iatrogenic air.  No  free fluid.  Colonic diverticulosis without diverticulitis.  Small bowel appears within normal limits.  Healed bilateral obturator ring fractures are present.  Diffuse osteopenia.  Sclerosis of the left sacral ala compatible with healed insufficiency fracture.  Chronic compression fractures of the thoracolumbar spine appear little changed compared to prior exam.  There is ankylosis of the L2-L3 and L4-L5.  L4 superior endplate  compression fracture is unchanged.  No retropulsion.  The L2 superior endplate compression fracture is unchanged with mild retropulsion.  The L1 compression fracture appears similar as well.  Vertebral augmentation at T11 and T12.  Partially visualized T10 superior endplate compression fracture is likely chronic.  Per CMS PQRS reporting requirements (PQRS Measure 24): Given the patient's age of greater than 50 and the fracture site (hip, distal radius, or spine), the patient should be tested for osteoporosis using DXA, and the appropriate treatment considered based on the DXA results.  There is a right greater trochanteric fracture with minimal extension inferiorly.  The femoral neck appears intact.  Left proximal femur appears intact.  IMPRESSION: 1.  Acute right greater trochanter fracture. Extension of fracture plane into the base of the greater trochanter.  The femoral neck is intact.  In this osteopenic patient, MRI of the right hip may be useful to further assess the extent of intertrochanteric extension. 2.  Healed obturator ring fractures, and left sacral ala fracture. 3.  Chronic multilevel thoracolumbar compression fractures with lower thoracic vertebral augmentation.  4.  Atherosclerosis with markedly tortuous abdominal aorta.  Original Report Authenticated By: Andreas Newport, M.D.   Dg Chest Portable 1 View  02/20/2011  *RADIOLOGY REPORT*  Clinical Data: Syncope.  Fall.  Weakness.  Hypertension.  PORTABLE CHEST - 1 VIEW  Comparison: 06/13/2010.  Findings: There is no pneumothorax identified.  Cardiopericardial silhouette is within normal limits for projection.  Upper thoracic vertebral augmentation.  No displaced rib fractures.  Emphysema is present with bilateral pleural apical scarring.  Osteopenia.  Lower thoracic vertebral augmentation is new compared to 06/13/2010. 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 Stable calcified granuloma in the left midlung.  IMPRESSION: No acute  cardiopulmonary disease.  Probable underlying emphysema and bilateral pleural apical scarring.  Original Report Authenticated By: Andreas Newport, M.D.    Pertinent items are noted in HPI. Blood pressure 100/57, pulse 87, temperature 98.2 F (36.8 C), temperature source Oral, resp. rate 16, SpO2 97.00%. The patient is awake and alert and follows complex commands. There is a small hematoma around the left eye.  Assessment/Plan: Impression is that of a left frontal hemorrhage secondary to a fall. There is no significant mass effect. The patient has significant atrophy of her brain and appears to be tolerating this hematoma without difficulty. The plan is for the patient to be admitted to the trauma service and will repeat her CT scan of the brain tomorrow morning.  Reinaldo Meeker, MD 02/20/2011, 11:41 AM

## 2011-02-20 NOTE — ED Provider Notes (Signed)
History     CSN: [id number] Arrival date & time: 02/20/2011  8:34 AM   First MD Initiated Contact with Patient 02/20/11 [phone]      Chief Complaint  Patient presents with  . Fall    (Consider location/radiation/quality/duration/timing/severity/associated sxs/prior treatment) Patient is a 76 y.o. female presenting with fall. The history is provided by the nursing home and medical records. The history is limited by the condition of the patient (Dementia).  Fall   77 year old female was transferred from [facility] after suffering a fall in a nursing home. CT scan there showed subarachnoid and subdural hematoma. Patient does not have any memory of what happened. She is complaining of some pain in her right hip area. She denies any other complaints. Past Medical History  Diagnosis Date  . GERD (gastroesophageal reflux disease)   . Osteoporosis   . Peptic ulcer disease   . Anemia of chronic disease   . Iron deficiency   . Peripheral neuropathy     Symptoms of  . Varicose veins   . Dizziness     chronic    Past Surgical History  Procedure Date  . Tonsillectomy   . Abdominal hysterectomy     Fibroids, ovaries intact, age 20  . Bladder repair     With hyst  . Appendectomy   . Cataract extraction     Implant  . Dexa 06/2000    Slightly improved osteopenia  . Spine surgery 07/2000    Compound fracture sping - T9, L1  . Fracture surgery     L2, L3 compoud fracture in the past  . Cardiolite 08/2001    Neg EF 65%  . Dexa 01/2003    OP at femeral neck  . Colonoscopy 02/2004    Divertics, hems  . Dexa 01/2005    Osteopenia at femoral neck  . Foot surgery 2007  . Fetal blood transfusion 10/2005  . Esophagogastroduodenoscopy 11/2005    Gastic ulcer  . Esophagogastroduodenoscopy 02/2006    Schatzki ring, HH, mucosal abnormalities, Bx H pylori  . Esophagogastroduodenoscopy 01/2008    Schatzki ring, HH    Family History  Problem Relation Age of Onset  .  Stroke Father   . Heart attack Brother     MI  . Heart attack Brother     MI  . Heart attack Brother     MI  . Colon cancer Other     Grandfather    History  Substance Use Topics  . Smoking status: Never Smoker   . Smokeless tobacco: Not on file  . Alcohol Use: Not on file    OB History    Grav Para Term Preterm Abortions TAB SAB Ect Mult Living                  Review of Systems  Unable to perform ROS   Allergies  Alprazolam; Calcitonin (salmon); Ibandronate sodium; and Raloxifene  Home Medications   Current Outpatient Rx  Name Route Sig Dispense Refill  . CALCIUM ALGINATE EX Apply externally Apply 1 application topically daily as needed. With optifoam to left ankle periwound as needed     . DRONABINOL 2.5 MG PO CAPS Oral Take 2.5 mg by mouth 2 (two) times daily before a meal.      . LEVOTHYROXINE SODIUM 25 MCG PO TABS Oral Take 25 mcg by mouth daily.      . DUODERM CGF BORDER DRESSING EX Apply externally Apply 1 application topically 2 (two) times  daily. For ulcer healing     . ERGOCALCIFEROL 50000 UNITS PO CAPS Oral Take 50,000 Units by mouth every 30 (thirty) days.      . FENTANYL 25 MCG/HR TD PT72 Transdermal Place 1 patch (25 mcg total) onto the skin every 3 (three) days. 10 patch 0  . HYDROCODONE-ACETAMINOPHEN 5-500 MG PO CAPS  Take 1/2 tablet by mouth up to every 6 hours during the day as needed for pain. *WATCH FOR SEDATION* 60 capsule 0  . LORATADINE 10 MG PO TABS Oral Take 10 mg by mouth daily as needed. For allergies    . MECLIZINE HCL 12.5 MG PO TABS Oral Take 1 tablet (12.5 mg total) by mouth 3 (three) times daily as needed for dizziness or nausea. 90 tablet 0  . MIRTAZAPINE 15 MG PO TABS Oral Take 15 mg by mouth at bedtime.      Marland Kitchen ONDANSETRON HCL 4 MG PO TABS Oral Take 4 mg by mouth every 8 (eight) hours as needed. nausea    . OXYCODONE HCL 5 MG PO CAPS Oral Take 5 mg by mouth every 6 (six) hours as needed. Breakthrough pain    . RANITIDINE HCL 150 MG PO  TABS Oral Take 150 mg by mouth 2 (two) times daily as needed. Indigestion/heartburn       BP 100/79  Pulse 91  Temp(Src) 98.2 F (36.8 C) (Oral)  Resp 20  SpO2 94%  Physical Exam  Nursing note and vitals reviewed.  77 year old female who is awake and alert but only oriented to person. Vital signs are normal. Small area of ecchymosis is seen inferior to the left eye. No other head trauma is seen. PERRLA, EOMI. Oropharynx is clear. Neck is nontender. Back is nontender. Lungs are clear without rales, wheezes, or rhonchi. Heart has regular rate and rhythm without murmur. Abdomen is soft and nontender. There is mild tenderness to palpation over the symphysis pubis and right pelvic rim. Extremities have no tenderness, there is full range of motion present. Neurologic: Mental status is as noted above. Speech is spontaneous, fluent, and appropriate. Cranial nerves are intact. There no focal motor or sensory deficits.  ED Course  Procedures (including critical care time)  Labs Reviewed  CBC - Abnormal; Notable for the following:    WBC 12.0 (*)    All other components within normal limits  POCT I-STAT, CHEM 8 - Abnormal; Notable for the following:    Glucose, Bld 148 (*)    Calcium, Ion 1.10 (*)    All other components within normal limits  PROTIME-INR  SAMPLE TO BLOOD BANK  COMPREHENSIVE METABOLIC PANEL  URINALYSIS, MICROSCOPIC ONLY  LACTIC ACID, PLASMA  I-STAT, CHEM 8  URINE CULTURE   Dg Chest Portable 1 View  02/20/2011  *RADIOLOGY REPORT*  Clinical Data: Syncope.  Fall.  Weakness.  Hypertension.  PORTABLE CHEST - 1 VIEW  Comparison: 06/13/2010.  Findings: There is no pneumothorax identified.  Cardiopericardial silhouette is within normal limits for projection.  Upper thoracic vertebral augmentation.  No displaced rib fractures.  Emphysema is present with bilateral pleural apical scarring.  Osteopenia.  Lower thoracic vertebral augmentation is new compared to 06/13/2010. Right greater  than left AC joint osteoarthritis. Stable calcified granuloma in the left midlung.  IMPRESSION: No acute cardiopulmonary disease.  Probable underlying emphysema and bilateral pleural apical scarring.  Original Report Authenticated By: Andreas Newport, M.D.    Results for orders placed during the hospital encounter of 02/20/11  COMPREHENSIVE METABOLIC PANEL  Component Value Range   Sodium 137  135 - 145 (mEq/L)   Potassium 4.1  3.5 - 5.1 (mEq/L)   Chloride 101  96 - 112 (mEq/L)   CO2 27  19 - 32 (mEq/L)   Glucose, Bld 145 (*) 70 - 99 (mg/dL)   BUN 21  6 - 23 (mg/dL)   Creatinine, Ser 3.08  0.50 - 1.10 (mg/dL)   Calcium 8.6  8.4 - 65.7 (mg/dL)   Total Protein 6.6  6.0 - 8.3 (g/dL)   Albumin 3.1 (*) 3.5 - 5.2 (g/dL)   AST 22  0 - 37 (U/L)   ALT 15  0 - 35 (U/L)   Alkaline Phosphatase 79  39 - 117 (U/L)   Total Bilirubin 0.4  0.3 - 1.2 (mg/dL)   GFR calc non Af Amer 75 (*) >90 (mL/min)   GFR calc Af Amer 87 (*) >90 (mL/min)  CBC      Component Value Range   WBC 12.0 (*) 4.0 - 10.5 (K/uL)   RBC 4.24  3.87 - 5.11 (MIL/uL)   Hemoglobin 12.7  12.0 - 15.0 (g/dL)   HCT 84.6  96.2 - 95.2 (%)   MCV 91.7  78.0 - 100.0 (fL)   MCH 30.0  26.0 - 34.0 (pg)   MCHC 32.6  30.0 - 36.0 (g/dL)   RDW 84.1  32.4 - 40.1 (%)   Platelets 118 (*) 150 - 400 (K/uL)  LACTIC ACID, PLASMA      Component Value Range   Lactic Acid, Venous 2.0  0.5 - 2.2 (mmol/L)  PROTIME-INR      Component Value Range   Prothrombin Time 13.9  11.6 - 15.2 (seconds)   INR 1.05  0.00 - 1.49   SAMPLE TO BLOOD BANK      Component Value Range   Blood Bank Specimen SAMPLE AVAILABLE FOR TESTING     Sample Expiration 02/21/2011    POCT I-STAT, CHEM 8      Component Value Range   Sodium 139  135 - 145 (mEq/L)   Potassium 4.2  3.5 - 5.1 (mEq/L)   Chloride 104  96 - 112 (mEq/L)   BUN 21  6 - 23 (mg/dL)   Creatinine, Ser 0.27  0.50 - 1.10 (mg/dL)   Glucose, Bld 253 (*) 70 - 99 (mg/dL)   Calcium, Ion 6.64 (*) 1.12 - 1.32  (mmol/L)   TCO2 26  0 - 100 (mmol/L)   Hemoglobin 13.6  12.0 - 15.0 (g/dL)   HCT 40.3  47.4 - 25.9 (%)   Ct Abdomen Pelvis W Contrast  02/20/2011  *RADIOLOGY REPORT*  Clinical Data: Trauma.  Fall.  Acute versus chronic hip injury. Low back and groin pain.  CT ABDOMEN AND PELVIS WITH CONTRAST  Technique:  Multidetector CT imaging of the abdomen and pelvis was performed following the standard protocol during bolus administration of intravenous contrast.  Contrast: OMNIPAQUE IOHEXOL 300 MG/ML IV SOLN  Comparison: 11/28/2008.  Findings: Atelectasis and nonspecific interstitial changes are present at the lung bases.  Visualized portions of the heart appear within normal limits.  The liver and gallbladder unremarkable. Adrenal glands normal.  Normal renal enhancement and delayed excretion of contrast.  Markedly tortuous abdominal aorta with kinking at the level of the renal arteries.  Maximal diameter infrarenal abdominal aorta is 22 mm.  Foley catheter present in the urinary bladder with iatrogenic air.  No free fluid.  Colonic diverticulosis without diverticulitis.  Small bowel appears within normal limits.  Healed  bilateral obturator ring fractures are present.  Diffuse osteopenia.  Sclerosis of the left sacral ala compatible with healed insufficiency fracture.  Chronic compression fractures of the thoracolumbar spine appear little changed compared to prior exam.  There is ankylosis of the L2-L3 and L4-L5.  L4 superior endplate compression fracture is unchanged.  No retropulsion.  The L2 superior endplate compression fracture is unchanged with mild retropulsion.  The L1 compression fracture appears similar as well.  Vertebral augmentation at T11 and T12.  Partially visualized T10 superior endplate compression fracture is likely chronic.  Per CMS PQRS reporting requirements (PQRS Measure 24): Given the patient's age of greater than 50 and the fracture site (hip, distal radius, or spine), the patient should  be tested for osteoporosis using DXA, and the appropriate treatment considered based on the DXA results.  There is a right greater trochanteric fracture with minimal extension inferiorly.  The femoral neck appears intact.  Left proximal femur appears intact.  IMPRESSION: 1.  Acute right greater trochanter fracture. Extension of fracture plane into the base of the greater trochanter.  The femoral neck is intact.  In this osteopenic patient, MRI of the right hip may be useful to further assess the extent of intertrochanteric extension. 2.  Healed obturator ring fractures, and left sacral ala fracture. 3.  Chronic multilevel thoracolumbar compression fractures with lower thoracic vertebral augmentation.  4.  Atherosclerosis with markedly tortuous abdominal aorta.  Original Report Authenticated By: Andreas Newport, M.D.   Dg Chest Portable 1 View  02/20/2011  *RADIOLOGY REPORT*  Clinical Data: Syncope.  Fall.  Weakness.  Hypertension.  PORTABLE CHEST - 1 VIEW  Comparison: 06/13/2010.  Findings: There is no pneumothorax identified.  Cardiopericardial silhouette is within normal limits for projection.  Upper thoracic vertebral augmentation.  No displaced rib fractures.  Emphysema is present with bilateral pleural apical scarring.  Osteopenia.  Lower thoracic vertebral augmentation is new compared to 06/13/2010. 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 Stable calcified granuloma in the left midlung.  IMPRESSION: No acute cardiopulmonary disease.  Probable underlying emphysema and bilateral pleural apical scarring.  Original Report Authenticated By: Andreas Newport, M.D.     No diagnosis found.  CT results show that the pelvic fracture are noted apparently is old, but she has a new fracture through the greater trochanter of the right hip. Dr. Magnus Ivan has been consulted for management of the hip racture and will see her in the hospital. Dr. Gerlene Fee has been consulted for neurosurgical management. Dr.  Dwain Sarna of the trauma surgery service has been consulted to admit the patient.  CRITICAL CARE Performed by: Dione Booze   Total critical care time: 45 minutes  Critical care time was exclusive of separately billable procedures and treating other patients.  Critical care was necessary to treat or prevent imminent or life-threatening deterioration.  Critical care was time spent personally by me on the following activities: development of treatment plan with patient and/or surrogate as well as nursing, discussions with consultants, evaluation of patient's response to treatment, examination of patient, obtaining history from patient or surrogate, ordering and performing treatments and interventions, ordering and review of laboratory studies, ordering and review of radiographic studies, pulse oximetry and re-evaluation of patient's condition.   MDM  Records from [facility] were reviewed and x-rays were removed. She has a fairly large of left frontal subdural hematoma and associated subarachnoid bleeding. Review of her pelvis films shows a fracture on the right side extending into the symphysis pubis. In light  of this finding, a CT scan will be obtained of the abdomen and pelvis both to clarify the extent of the fracture, and to rule out occult internal trauma.        Dione Booze, MD 02/20/11 1228

## 2011-02-20 NOTE — ED Notes (Signed)
Family at bedside. 

## 2011-02-20 NOTE — Progress Notes (Signed)
MEDICATION RELATED CONSULT NOTE - INITIAL   Pharmacy Consult for review of medications Indication: medication increasing fall risk  Allergies  Allergen Reactions  . Alprazolam     REACTION: headaches  . Calcitonin ([place])     REACTION: dizzy  . Ibandronate Sodium     REACTION: dysphagia  . Raloxifene     REACTION: cramps    Medical History: Past Medical History  Diagnosis Date  . GERD (gastroesophageal reflux disease)   . Osteoporosis   . Peptic ulcer disease   . Anemia of chronic disease   . Iron deficiency   . Peripheral neuropathy     Symptoms of  . Varicose veins   . Dizziness     chronic    Medications:  Prescriptions prior to admission  Medication Sig Dispense Refill  . CALCIUM ALGINATE EX Apply 1 application topically daily as needed. With optifoam to left ankle periwound as needed       . Control Gel Formula Dressing (DUODERM CGF BORDER DRESSING EX) Apply 1 application topically 2 (two) times daily. For ulcer healing       . dronabinol (MARINOL) 2.5 MG capsule Take 2.5 mg by mouth 2 (two) times daily before a meal.        . ergocalciferol (VITAMIN D2) 50000 UNITS capsule Take 50,000 Units by mouth every 30 (thirty) days.        . fentaNYL (DURAGESIC - DOSED MCG/HR) 25 MCG/HR Place 1 patch (25 mcg total) onto the skin every 3 (three) days.  10 patch  0  . hydrocodone-acetaminophen (LORCET-HD) 5-500 MG per capsule Take 1/2 tablet by mouth up to every 6 hours during the day as needed for pain. *WATCH FOR SEDATION*  60 capsule  0  . levothyroxine (SYNTHROID, LEVOTHROID) 25 MCG tablet Take 25 mcg by mouth daily.        Marland Kitchen loratadine (CLARITIN) 10 MG tablet Take 10 mg by mouth daily as needed. For allergies      . meclizine (ANTIVERT) 12.5 MG tablet Take 1 tablet (12.5 mg total) by mouth 3 (three) times daily as needed for dizziness or nausea.  90 tablet  0  . mirtazapine (REMERON) 15 MG tablet Take 15 mg by mouth at bedtime.        . ondansetron (ZOFRAN) 4 MG tablet  Take 4 mg by mouth every 8 (eight) hours as needed. nausea      . oxycodone (OXY-IR) 5 MG capsule Take 5 mg by mouth every 6 (six) hours as needed. Breakthrough pain      . ranitidine (ZANTAC) 150 MG tablet Take 150 mg by mouth 2 (two) times daily as needed. Indigestion/heartburn        Scheduled:    . sodium chloride   Intravenous Once  . calcium-vitamin D  1 tablet Oral Daily  . docusate sodium  100 mg Oral BID  . dronabinol  2.5 mg Oral BID AC  . Ensure Plus  1 Can Oral BID  . famotidine  20 mg Oral BID  . fentaNYL  25 mcg Transdermal Q72H  . levothyroxine  25 mcg Oral Daily  . LORazepam  0.5 mg Oral QHS  . mirtazapine  15 mg Oral QHS  .  morphine injection  4 mg Intravenous Once  . multivitamins ther. w/minerals  1 tablet Oral Daily  . pantoprazole  40 mg Oral Q1200  . senna-docusate  2 tablet Oral QHS  . Vitamin D (Ergocalciferol)  50,000 Units Oral Q30 days  .  DISCONTD: denosumab  60 mg Subcutaneous Q6 months  . DISCONTD: denosumab  60 mg Subcutaneous Q6 months  . DISCONTD: Ibandronate Sodium  1 tablet Oral Q30 days   PRN: HYDROcodone-acetaminophen, HYDROcodone-acetaminophen, HYDROcodone-acetaminophen, iohexol, loratadine, meclizine, morphine, ondansetron  Assessment/Plan: Pharmacy consulted to review medication with increased fall risk.   I compared inpatient medications to outpatient medications. Dronabinol, fentanyl patch, and mirtazapine all have sedating effects and can increase fall risk, however, patient was on these medication PTA. Lorazepam is scheduled inpatient and could be changed to prn.  Otherwise, I do not see any new medications which may have contributed to her fall.  Please re-consult if further help needed.  [place], Ronna Herskowitz Danielle 02/20/2011,5:31 PM

## 2011-02-20 NOTE — H&P (Signed)
Tiffany Austin is an 77 y.o. female.    Chief Complaint: Fall  HPI: 77 year old white female fell at facility where she resides. She is amnestic to event. She was taken to [facility] where she was diagnosed with a left frontal SDH. She was transferred to [facility] for further care. She has no c/o on questioning.  Past Medical History  Diagnosis Date  . GERD (gastroesophageal reflux disease)   . Osteoporosis   . Peptic ulcer disease   . Anemia of chronic disease   . Iron deficiency   . Peripheral neuropathy     Symptoms of  . Varicose veins   . Dizziness     chronic    Past Surgical History  Procedure Date  . Tonsillectomy   . Abdominal hysterectomy     Fibroids, ovaries intact, age 18  . Bladder repair     With hyst  . Appendectomy   . Cataract extraction     Implant  . Dexa 06/2000    Slightly improved osteopenia  . Spine surgery 07/2000    Compound fracture sping - T9, L1  . Fracture surgery     L2, L3 compoud fracture in the past  . Cardiolite 08/2001    Neg EF 65%  . Dexa 01/2003    OP at femeral neck  . Colonoscopy 02/2004    Divertics, hems  . Dexa 01/2005    Osteopenia at femoral neck  . Foot surgery 2007  . Fetal blood transfusion 10/2005  . Esophagogastroduodenoscopy 11/2005    Gastic ulcer  . Esophagogastroduodenoscopy 02/2006    Schatzki ring, HH, mucosal abnormalities, Bx H pylori  . Esophagogastroduodenoscopy 01/2008    Schatzki ring, HH    Family History  Problem Relation Age of Onset  . Stroke Father   . Heart attack Brother     MI  . Heart attack Brother     MI  . Heart attack Brother     MI  . Colon cancer Other     Grandfather   Social History:  reports that she has never smoked. She does not have any smokeless tobacco history on file. Her alcohol and drug histories not on file.  Allergies:  Allergies  Allergen Reactions  . Alprazolam     REACTION: headaches  . Calcitonin ([place])     REACTION: dizzy  . Ibandronate Sodium     REACTION:  dysphagia  . Raloxifene     REACTION: cramps    Medications Prior to Admission  Medication Dose Route Frequency Provider Last Rate Last Dose  . 0.9 %  sodium chloride infusion   Intravenous Once Dione Booze, MD 20 mL/hr at 02/20/11 0903 20 mL at 02/20/11 0903  . denosumab (PROLIA) injection 60 mg  60 mg Subcutaneous Q6 months Ruthe Mannan, MD   60 mg at 12/04/10 1342  . iohexol (OMNIPAQUE) 300 MG/ML solution 100 mL  100 mL Intravenous Once PRN Medication Radiologist   100 mL at 02/20/11 1105  . morphine 4 MG/ML injection 4 mg  4 mg Intravenous Once Dione Booze, MD       Medications Prior to Admission  Medication Sig Dispense Refill  . dronabinol (MARINOL) 2.5 MG capsule Take 2.5 mg by mouth 2 (two) times daily before a meal.        . ergocalciferol (VITAMIN D2) 50000 UNITS capsule Take 50,000 Units by mouth every 30 (thirty) days.        . fentaNYL (DURAGESIC - DOSED MCG/HR) 25  MCG/HR Place 1 patch (25 mcg total) onto the skin every 3 (three) days.  10 patch  0  . hydrocodone-acetaminophen (LORCET-HD) 5-500 MG per capsule Take 1/2 tablet by mouth up to every 6 hours during the day as needed for pain. *WATCH FOR SEDATION*  60 capsule  0  . levothyroxine (SYNTHROID, LEVOTHROID) 25 MCG tablet Take 25 mcg by mouth daily.        Marland Kitchen loratadine (CLARITIN) 10 MG tablet Take 10 mg by mouth daily as needed. For allergies      . meclizine (ANTIVERT) 12.5 MG tablet Take 1 tablet (12.5 mg total) by mouth 3 (three) times daily as needed for dizziness or nausea.  90 tablet  0  . mirtazapine (REMERON) 15 MG tablet Take 15 mg by mouth at bedtime.        . ondansetron (ZOFRAN) 4 MG tablet Take 4 mg by mouth every 8 (eight) hours as needed. nausea      . oxycodone (OXY-IR) 5 MG capsule Take 5 mg by mouth every 6 (six) hours as needed. Breakthrough pain        Results for orders placed during the hospital encounter of 02/20/11 (from the past 48 hour(s))  COMPREHENSIVE METABOLIC PANEL     Status: Abnormal    Collection Time   02/20/11  9:03 AM      Component Value Range Comment   Sodium 137  135 - 145 (mEq/L)    Potassium 4.1  3.5 - 5.1 (mEq/L)    Chloride 101  96 - 112 (mEq/L)    CO2 27  19 - 32 (mEq/L)    Glucose, Bld 145 (*) 70 - 99 (mg/dL)    BUN 21  6 - 23 (mg/dL)    Creatinine, Ser 1.19  0.50 - 1.10 (mg/dL)    Calcium 8.6  8.4 - 10.5 (mg/dL)    Total Protein 6.6  6.0 - 8.3 (g/dL)    Albumin 3.1 (*) 3.5 - 5.2 (g/dL)    AST 22  0 - 37 (U/L)    ALT 15  0 - 35 (U/L)    Alkaline Phosphatase 79  39 - 117 (U/L)    Total Bilirubin 0.4  0.3 - 1.2 (mg/dL)    GFR calc non Af Amer 75 (*) >90 (mL/min)    GFR calc Af Amer 87 (*) >90 (mL/min)   CBC     Status: Abnormal   Collection Time   02/20/11  9:03 AM      Component Value Range Comment   WBC 12.0 (*) 4.0 - 10.5 (K/uL)    RBC 4.24  3.87 - 5.11 (MIL/uL)    Hemoglobin 12.7  12.0 - 15.0 (g/dL)    HCT 14.7  82.9 - 56.2 (%)    MCV 91.7  78.0 - 100.0 (fL)    MCH 30.0  26.0 - 34.0 (pg)    MCHC 32.6  30.0 - 36.0 (g/dL)    RDW 13.0  86.5 - 78.4 (%)    Platelets 118 (*) 150 - 400 (K/uL) PLATELET COUNT CONFIRMED BY SMEAR  LACTIC ACID, PLASMA     Status: Normal   Collection Time   02/20/11  9:03 AM      Component Value Range Comment   Lactic Acid, Venous 2.0  0.5 - 2.2 (mmol/L)   PROTIME-INR     Status: Normal   Collection Time   02/20/11  9:03 AM      Component Value Range Comment   Prothrombin Time 13.9  11.6 - 15.2 (seconds)    INR 1.05  0.00 - 1.49    SAMPLE TO BLOOD BANK     Status: Normal   Collection Time   02/20/11  9:03 AM      Component Value Range Comment   Blood Bank Specimen SAMPLE AVAILABLE FOR TESTING      Sample Expiration 02/21/2011     POCT I-STAT, CHEM 8     Status: Abnormal   Collection Time   02/20/11  9:16 AM      Component Value Range Comment   Sodium 139  135 - 145 (mEq/L)    Potassium 4.2  3.5 - 5.1 (mEq/L)    Chloride 104  96 - 112 (mEq/L)    BUN 21  6 - 23 (mg/dL)    Creatinine, Ser 1.61  0.50 - 1.10 (mg/dL)      Glucose, Bld 096 (*) 70 - 99 (mg/dL)    Calcium, Ion 0.45 (*) 1.12 - 1.32 (mmol/L)    TCO2 26  0 - 100 (mmol/L)    Hemoglobin 13.6  12.0 - 15.0 (g/dL)    HCT 40.9  81.1 - 91.4 (%)   URINALYSIS, MICROSCOPIC ONLY     Status: Abnormal   Collection Time   02/20/11 11:48 AM      Component Value Range Comment   Color, Urine YELLOW  YELLOW     APPearance CLEAR  CLEAR     Specific Gravity, Urine >1.046 (*) 1.005 - 1.030     pH 8.5 (*) 5.0 - 8.0     Glucose, UA NEGATIVE  NEGATIVE (mg/dL)    Hgb urine dipstick MODERATE (*) NEGATIVE     Bilirubin Urine NEGATIVE  NEGATIVE     Ketones, ur NEGATIVE  NEGATIVE (mg/dL)    Protein, ur NEGATIVE  NEGATIVE (mg/dL)    Urobilinogen, UA 0.2  0.0 - 1.0 (mg/dL)    Nitrite NEGATIVE  NEGATIVE     Leukocytes, UA NEGATIVE  NEGATIVE     RBC / HPF 3-6  <3 (RBC/hpf)    Ct Abdomen Pelvis W Contrast  02/20/2011  *RADIOLOGY REPORT*  Clinical Data: Trauma.  Fall.  Acute versus chronic hip injury. Low back and groin pain.  CT ABDOMEN AND PELVIS WITH CONTRAST  Technique:  Multidetector CT imaging of the abdomen and pelvis was performed following the standard protocol during bolus administration of intravenous contrast.  Contrast: OMNIPAQUE IOHEXOL 300 MG/ML IV SOLN  Comparison: 11/28/2008.  Findings: Atelectasis and nonspecific interstitial changes are present at the lung bases.  Visualized portions of the heart appear within normal limits.  The liver and gallbladder unremarkable. Adrenal glands normal.  Normal renal enhancement and delayed excretion of contrast.  Markedly tortuous abdominal aorta with kinking at the level of the renal arteries.  Maximal diameter infrarenal abdominal aorta is 22 mm.  Foley catheter present in the urinary bladder with iatrogenic air.  No free fluid.  Colonic diverticulosis without diverticulitis.  Small bowel appears within normal limits.  Healed bilateral obturator ring fractures are present.  Diffuse osteopenia.  Sclerosis of the left  sacral ala compatible with healed insufficiency fracture.  Chronic compression fractures of the thoracolumbar spine appear little changed compared to prior exam.  There is ankylosis of the L2-L3 and L4-L5.  L4 superior endplate compression fracture is unchanged.  No retropulsion.  The L2 superior endplate compression fracture is unchanged with mild retropulsion.  The L1 compression fracture appears similar as well.  Vertebral augmentation at T11 and T12.  Partially visualized T10 superior endplate compression fracture is likely chronic.  Per CMS PQRS reporting requirements (PQRS Measure 24): Given the patient's age of greater than 50 and the fracture site (hip, distal radius, or spine), the patient should be tested for osteoporosis using DXA, and the appropriate treatment considered based on the DXA results.  There is a right greater trochanteric fracture with minimal extension inferiorly.  The femoral neck appears intact.  Left proximal femur appears intact.  IMPRESSION: 1.  Acute right greater trochanter fracture. Extension of fracture plane into the base of the greater trochanter.  The femoral neck is intact.  In this osteopenic patient, MRI of the right hip may be useful to further assess the extent of intertrochanteric extension. 2.  Healed obturator ring fractures, and left sacral ala fracture. 3.  Chronic multilevel thoracolumbar compression fractures with lower thoracic vertebral augmentation.  4.  Atherosclerosis with markedly tortuous abdominal aorta.  Original Report Authenticated By: Andreas Newport, M.D.   Dg Chest Portable 1 View  02/20/2011  *RADIOLOGY REPORT*  Clinical Data: Syncope.  Fall.  Weakness.  Hypertension.  PORTABLE CHEST - 1 VIEW  Comparison: 06/13/2010.  Findings: There is no pneumothorax identified.  Cardiopericardial silhouette is within normal limits for projection.  Upper thoracic vertebral augmentation.  No displaced rib fractures.  Emphysema is present with bilateral pleural  apical scarring.  Osteopenia.  Lower thoracic vertebral augmentation is new compared to 06/13/2010. 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 Stable calcified granuloma in the left midlung.  IMPRESSION: No acute cardiopulmonary disease.  Probable underlying emphysema and bilateral pleural apical scarring.  Original Report Authenticated By: Andreas Newport, M.D.    Review of Systems  HENT: Negative for neck pain.   Musculoskeletal: Negative for back pain and joint pain.  All other systems reviewed and are negative.    Blood pressure 100/57, pulse 87, temperature 98.2 F (36.8 C), temperature source Oral, resp. rate 16, SpO2 97.00%. Physical Exam  Vitals reviewed. Constitutional: She appears cachectic. No distress.  HENT:  Head: Normocephalic.    Right Ear: External ear normal.  Left Ear: External ear normal.  Nose: Nose normal.  Mouth/Throat: Oropharynx is clear and moist. No oropharyngeal exudate.  Eyes: Conjunctivae and EOM are normal. Pupils are equal, round, and reactive to light. Right eye exhibits no discharge. Left eye exhibits no discharge. No scleral icterus.  Neck: Normal range of motion. Neck supple. No tracheal deviation present.  Cardiovascular: S1 normal, S2 normal and intact distal pulses.  An irregularly irregular rhythm present. Exam reveals no gallop and no friction rub.   Murmur heard.  Systolic murmur is present with a grade of 2/6  Respiratory: Effort normal and breath sounds normal. No stridor. No respiratory distress. She has no wheezes. She has no rales. She exhibits no tenderness.  GI: Soft. Bowel sounds are normal. She exhibits no distension. There is no tenderness. There is no rebound and no guarding.  Musculoskeletal:       Cervical back: She exhibits no tenderness.       Thoracic back: She exhibits bony tenderness.       Lumbar back: She exhibits bony tenderness.  Neurological: She is alert.  Skin: Skin is warm and dry.  Psychiatric: She has a  normal mood and affect. Her behavior is normal.     Assessment/Plan Fall TBI w/SDH -- No s/sx at present except for amnesia. Will admit to NICU and get a repeat CT in am. Dr. Gerlene Fee has seen already and formulated  plan. Right hip fx -- Dr. Magnus Ivan is aware and told Dr. Preston Fleeting she could be WBAT on fx. He will see later. PT/OT. Multiple medical problems -- Will ask IM to follow along with [place]. Dispo -- to 3100  Freeman Caldron, PA-C Pager: [phone] General Trauma PA Pager: [phone] 02/20/2011, 12:44 PM

## 2011-02-20 NOTE — ED Notes (Signed)
Per [facility], pt from NH in [place], found on floor by staff this am, unsure how [place] pt was on floor, alert and conscious per pt's norm when found unsure of LOC, pt c/o R hip pain, pt has small bruise to L eye, pt seen at [facility] and dx w/ L Subranachoid and Subdural hematoma, CT of C spine and R hip are negative, possible UTI, foley cath placed, 20g SL RAC

## 2011-02-21 ENCOUNTER — Inpatient Hospital Stay (HOSPITAL_COMMUNITY): Payer: Medicare Other

## 2011-02-21 LAB — URINE CULTURE: Culture: NO GROWTH

## 2011-02-21 NOTE — Progress Notes (Signed)
Patient ID: Tiffany Austin, female   DOB: 1924-08-29, 77 y.o.   MRN: [id number] No acute changes. In Neuro ICU secondary to [facility]. Complains of right hip pain. VSS Right hip with pain over greater trochanter  Plan: Attempt 50% to full weight with walker

## 2011-02-21 NOTE — Plan of Care (Signed)
Problem: Phase II Progression Outcomes Goal: Tolerating diet Outcome: Not Progressing 12/9: Pt on regular diet. Refuses to eat.  Problem: Phase III Progression Outcomes Goal: Activity at appropriate level-compared to baseline (UP IN CHAIR FOR HEMODIALYSIS)  Outcome: Progressing 12/9: Pt was assisted out of bed by physical therapy.

## 2011-02-21 NOTE — Progress Notes (Signed)
[name] Age: 77 y.o. Sex: female Date of Birth: [birth year] Admit Date: 02/20/2011 ZOX:[id number] Dayton Martes, MD, MD  Subjective: No headaches. Telemetry strips reviewed-no evidence of atrial fibrillation.  Objective: Vital signs in last 24 hours: Filed Vitals:   02/21/11 1000 02/21/11 1100 02/21/11 1121 02/21/11 1200  BP: 109/73 127/67  112/79  Pulse:    94  Temp:   97.7 F (36.5 C)   TempSrc:   Oral   Resp: 16 16  16   Height:      Weight:      SpO2: 95% 94%  96%    Weight change:   Body mass index is 15.44 kg/(m^2).  Intake/Output from previous day:  Intake/Output Summary (Last 24 hours) at 02/21/11 1327 Last data filed at 02/21/11 1200  Gross per 24 hour  Intake 1001.67 ml  Output    950 ml  Net  51.67 ml    PHYSICAL EXAM: Gen Exam: Awake and alert with clear speech.   Neck: Supple, No JVD.   Chest: B/L Clear.   CVS: S1 S2 Regular, no murmurs.  Abdomen: soft, BS +, non tender, non distended.  Extremities: no edema, lower extremities warm to touch. Neurologic: Non Focal.   Skin: No Rash.   Wounds: N/A.    LAB RESULTS: CBC  Lab 02/20/11 0916 02/20/11 0903  WBC -- 12.0*  HGB 13.6 12.7  HCT 40.0 38.9  PLT -- 118*  MCV -- 91.7  MCH -- 30.0  MCHC -- 32.6  RDW -- 14.2  LYMPHSABS -- --  MONOABS -- --  EOSABS -- --  BASOSABS -- --  BANDABS -- --    Chemistries   Lab 02/20/11 0916 02/20/11 0903  NA 139 137  K 4.2 4.1  CL 104 101  CO2 -- 27  GLUCOSE 148* 145*  BUN 21 21  CREATININE 0.90 0.77  CALCIUM -- 8.6  MG -- --    GFR Estimated Creatinine Clearance: 30 ml/min (by C-G formula based on Cr of 0.9).  Coagulation profile  Lab 02/20/11 0903  INR 1.05  PROTIME --    Cardiac Enzymes No results found for this basename: CK:3,CKMB:3,TROPONINI:3,MYOGLOBIN:3 in the last 168 hours  No results found for this basename: POCBNP:3 in the last 168 hours No results found for this basename: DDIMER:2 in the last 72 hours No  results found for this basename: HGBA1C:2 in the last 72 hours No results found for this basename: CHOL:2,HDL:2,LDLCALC:2,TRIG:2,CHOLHDL:2,LDLDIRECT:2 in the last 72 hours No results found for this basename: TSH,T4TOTAL,FREET3,T3FREE,THYROIDAB in the last 72 hours No results found for this basename: VITAMINB12:2,FOLATE:2,FERRITIN:2,TIBC:2,IRON:2,RETICCTPCT:2 in the last 72 hours No results found for this basename: LIPASE:2,AMYLASE:2 in the last 72 hours  Urine Studies No results found for this basename: UACOL:2,UAPR:2,USPG:2,UPH:2,UTP:2,UGL:2,UKET:2,UBIL:2,UHGB:2,UNIT:2,UROB:2,ULEU:2,UEPI:2,UWBC:2,URBC:2,UBAC:2,CAST:2,CRYS:2,UCOM:2,BILUA:2 in the last 72 hours  MICROBIOLOGY: No results found for this or any previous visit (from the past 240 hour(s)).  RADIOLOGY STUDIES/RESULTS: Ct Head Wo Contrast  02/21/2011  *RADIOLOGY REPORT*  Clinical Data: Follow-up left frontal hemorrhage  CT HEAD WITHOUT CONTRAST  Technique:  Contiguous axial images were obtained from the base of the skull through the vertex without contrast.  Comparison: 06/13/2010  Findings: Motion artifact limits the study.  There is a left inferior frontal hematoma measuring about 2.8 x 4.2 cm.  This appears to represent intraparenchymal hematoma although an extra- axial collection is not entirely excluded.  Smaller rounded area of acute hemorrhage in the posterior left inferior temporal region measuring 1.2 cm.  Smaller focal intraparenchymal hemorrhage  in the right periventricular white matter measuring 1.2 cm diameter. Subarachnoid hemorrhage in the right sulci.  Intraventricular hemorrhage with blood layering in the posterior ventricular horns. These changes are new since the previous comparison study from 06/13/2010.  Since the history indicates that this is a follow-up study, I am presuming that there is another outside study which is not available at the time of dictation.  There is some mass effect in the left frontal region with  effacement of the anterior horn of the lateral ventricle.  Minimal midline shift.  Diffuse cerebral atrophy.  Ventricular dilatation consistent with central atrophy.  Low attenuation change in the deep white matter consistent small vessel ischemia.  No effacement of basal cisterns. Vascular calcifications.  No depressed skull fractures.  Visualized paranasal sinuses are not opacified.  IMPRESSION: Left anterior frontal, left posterior temporal, and right white matter hematomas.  Subarachnoid blood on the right. Intraventricular hemorrhage.  No significant mass effect or cerebral edema.  No recent comparison studies available.  Results discussed with the patient's nurse on 3100, [name], at 0446 hours on 02/21/2011.  Original Report Authenticated By: Marlon Pel, M.D.   Ct Abdomen Pelvis W Contrast  02/20/2011  *RADIOLOGY REPORT*  Clinical Data: Trauma.  Fall.  Acute versus chronic hip injury. Low back and groin pain.  CT ABDOMEN AND PELVIS WITH CONTRAST  Technique:  Multidetector CT imaging of the abdomen and pelvis was performed following the standard protocol during bolus administration of intravenous contrast.  Contrast: OMNIPAQUE IOHEXOL 300 MG/ML IV SOLN  Comparison: 11/28/2008.  Findings: Atelectasis and nonspecific interstitial changes are present at the lung bases.  Visualized portions of the heart appear within normal limits.  The liver and gallbladder unremarkable. Adrenal glands normal.  Normal renal enhancement and delayed excretion of contrast.  Markedly tortuous abdominal aorta with kinking at the level of the renal arteries.  Maximal diameter infrarenal abdominal aorta is 22 mm.  Foley catheter present in the urinary bladder with iatrogenic air.  No free fluid.  Colonic diverticulosis without diverticulitis.  Small bowel appears within normal limits.  Healed bilateral obturator ring fractures are present.  Diffuse osteopenia.  Sclerosis of the left sacral ala compatible with healed  insufficiency fracture.  Chronic compression fractures of the thoracolumbar spine appear little changed compared to prior exam.  There is ankylosis of the L2-L3 and L4-L5.  L4 superior endplate compression fracture is unchanged.  No retropulsion.  The L2 superior endplate compression fracture is unchanged with mild retropulsion.  The L1 compression fracture appears similar as well.  Vertebral augmentation at T11 and T12.  Partially visualized T10 superior endplate compression fracture is likely chronic.  Per CMS PQRS reporting requirements (PQRS Measure 24): Given the patient's age of greater than 50 and the fracture site (hip, distal radius, or spine), the patient should be tested for osteoporosis using DXA, and the appropriate treatment considered based on the DXA results.  There is a right greater trochanteric fracture with minimal extension inferiorly.  The femoral neck appears intact.  Left proximal femur appears intact.  IMPRESSION: 1.  Acute right greater trochanter fracture. Extension of fracture plane into the base of the greater trochanter.  The femoral neck is intact.  In this osteopenic patient, MRI of the right hip may be useful to further assess the extent of intertrochanteric extension. 2.  Healed obturator ring fractures, and left sacral ala fracture. 3.  Chronic multilevel thoracolumbar compression fractures with lower thoracic vertebral augmentation.  4.  Atherosclerosis with  markedly tortuous abdominal aorta.  Original Report Authenticated By: Andreas Newport, M.D.   Dg Chest Portable 1 View  02/20/2011  *RADIOLOGY REPORT*  Clinical Data: Syncope.  Fall.  Weakness.  Hypertension.  PORTABLE CHEST - 1 VIEW  Comparison: 06/13/2010.  Findings: There is no pneumothorax identified.  Cardiopericardial silhouette is within normal limits for projection.  Upper thoracic vertebral augmentation.  No displaced rib fractures.  Emphysema is present with bilateral pleural apical scarring.  Osteopenia.  Lower  thoracic vertebral augmentation is new compared to 06/13/2010. 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 Stable calcified granuloma in the left midlung.  IMPRESSION: No acute cardiopulmonary disease.  Probable underlying emphysema and bilateral pleural apical scarring.  Original Report Authenticated By: Andreas Newport, M.D.    MEDICATIONS: Scheduled Meds:   . calcium-vitamin D  1 tablet Oral Daily  . docusate sodium  100 mg Oral BID  . dronabinol  2.5 mg Oral BID AC  . Ensure Plus  1 Can Oral BID  . famotidine  20 mg Oral BID  . fentaNYL  25 mcg Transdermal Q72H  . levothyroxine  25 mcg Oral Daily  . LORazepam  0.5 mg Oral QHS  . mirtazapine  15 mg Oral QHS  .  morphine injection  4 mg Intravenous Once  . multivitamins ther. w/minerals  1 tablet Oral Daily  . pantoprazole  40 mg Oral Q1200  . senna-docusate  2 tablet Oral QHS  . Vitamin D (Ergocalciferol)  50,000 Units Oral Q30 days  . DISCONTD: denosumab  60 mg Subcutaneous Q6 months  . DISCONTD: denosumab  60 mg Subcutaneous Q6 months  . DISCONTD: Ibandronate Sodium  1 tablet Oral Q30 days   Continuous Infusions:   . dextrose 5 % and 0.9% NaCl 50 mL/hr at 02/21/11 0500   PRN Meds:.HYDROcodone-acetaminophen, HYDROcodone-acetaminophen, HYDROcodone-acetaminophen, loratadine, meclizine, morphine, ondansetron  Antibiotics: Anti-infectives    None     Assessment/Plan:  Fall  -Not sure whether this was a  syncopal episode  -No arrhythmias seen on telemetry so far  -Awaiting 2-D echo.   Subdural hematoma -Neurosurgery following   Right femoral fracture -Orthopedics following  Hypothyroidism -Continue with levothyroxine  Osteoporosis -Resume bisphosphonates on discharge. Continue with vitamin D and calcium supplementation. -Will need outpatient yearly DEXA scan.  GERD -Continue with PPI  Disposition: Would likely need SNF at discharge  Rest per primary service.  Maretta Bees,  MD. 02/21/2011, 1:27  PM

## 2011-02-21 NOTE — Progress Notes (Signed)
The patient was seen, examined and PA note reviewed and data reviewed.  I agree with the plan of action. 

## 2011-02-21 NOTE — Progress Notes (Signed)
Patient ID: Tiffany Austin, female   DOB: 12-23-1924, 77 y.o.   MRN: [id number]   LOS: 1 day   Subjective: No c/o. Denies pain. Refusing to take some medications.  Objective: Vital signs in last 24 hours: Temp:  [97.7 F (36.5 C)-98.9 F (37.2 C)] 97.7 F (36.5 C) (12/09 1121) Pulse Rate:  [69-89] 73  (12/09 0500) Resp:  [12-20] 14  (12/09 0500) BP: (98-141)/(54-76) 125/62 mmHg (12/09 0500) SpO2:  [96 %-100 %] 100 % (12/09 0500) FiO2 (%):  [2 %] 2 % (12/09 0600) Weight:  [40.8 kg (89 lb 15.2 oz)] 89 lb 15.2 oz (40.8 kg) (12/08 1645)   *RADIOLOGY REPORT*  Clinical Data: Follow-up left frontal hemorrhage  CT HEAD WITHOUT CONTRAST  Technique: Contiguous axial images were obtained from the base of  the skull through the vertex without contrast.  Comparison: 06/13/2010  Findings: Motion artifact limits the study. There is a left  inferior frontal hematoma measuring about 2.8 x 4.2 cm. This  appears to represent intraparenchymal hematoma although an extra-  axial collection is not entirely excluded. Smaller rounded area of  acute hemorrhage in the posterior left inferior temporal region  measuring 1.2 cm. Smaller focal intraparenchymal hemorrhage in the  right periventricular white matter measuring 1.2 cm diameter.  Subarachnoid hemorrhage in the right sulci. Intraventricular  hemorrhage with blood layering in the posterior ventricular horns.  These changes are new since the previous comparison study from  06/13/2010. Since the history indicates that this is a follow-up  study, I am presuming that there is another outside study which is  not available at the time of dictation.  There is some mass effect in the left frontal region with  effacement of the anterior horn of the lateral ventricle. Minimal  midline shift.  Diffuse cerebral atrophy. Ventricular dilatation consistent with  central atrophy. Low attenuation change in the deep white matter  consistent small vessel ischemia.  No effacement of basal cisterns.  Vascular calcifications. No depressed skull fractures. Visualized  paranasal sinuses are not opacified.  IMPRESSION:  Left anterior frontal, left posterior temporal, and right white  matter hematomas. Subarachnoid blood on the right.  Intraventricular hemorrhage. No significant mass effect or  cerebral edema. No recent comparison studies available.  Results discussed with the patient's nurse on 3100, [name], at 0446  hours on 02/21/2011.  Original Report Authenticated By: Marlon Pel, M.D.  General appearance: alert, cachectic and no distress Resp: clear to auscultation bilaterally Cardio: regular rate and rhythm GI: normal findings: soft, non-tender and abnormal findings:  hypoactive bowel sounds Neurologic: Mental status: Ox2 (1914)  Assessment/Plan: Fall TBI w/SDH -- No change on HCT. Seems baseline from MS standpoint. Right hip fx -- WBAT. For PT eval today. Multiple medical problems -- per IM. Stable. FEN -- No issues VTE -- SCD's Dispo -- to floor. Will likely need SNF at d/c, has been to [facility] before. Spoke with daughter and son.   Freeman Caldron, PA-C Pager: [phone] General Trauma PA Pager: [phone]   02/21/2011

## 2011-02-21 NOTE — Progress Notes (Signed)
Physical Therapy Evaluation [name] MRN: [id number] DOB: Nov 15, 1924 Today's Date: 02/21/2011  Problem List:  Patient Active Problem List  Diagnoses  . HELICOBACTER PYLORI GASTRITIS  . HYPOTHYROIDISM  . HYPERKALEMIA  . ANEMIA OF CHRONIC DISEASE  . DEPRESSION  . PERIPHERAL NEUROPATHY  . LBBB  . GERD  . PEPTIC ULCER DISEASE  . UTI  . DEGENERATIVE DISC DISEASE, LUMBAR SPINE  . BACK PAIN, THORACIC REGION  . BACK PAIN, CHRONIC  . OSTEOPOROSIS  . DIZZINESS, CHRONIC  . Other Malaise and Fatigue  . INCONTINENCE  . VERTEBRAL FRACTURE, THORACIC SPINE  . FRACTURE, PELVIS  . CLOSED FRACTURE OF UNSPECIFIED BONE OF FOOT  . SKIN CANCER, HX OF  . Decubitus ulcer  . Vertigo  . Failure to thrive in adult  . Fall  . Subdural hemorrhage following injury  . Right femur fx    Past Medical History:  Past Medical History  Diagnosis Date  . GERD (gastroesophageal reflux disease)   . Osteoporosis   . Peptic ulcer disease   . Anemia of chronic disease   . Iron deficiency   . Peripheral neuropathy     Symptoms of  . Varicose veins   . Dizziness     chronic   Past Surgical History:  Past Surgical History  Procedure Date  . Tonsillectomy   . Abdominal hysterectomy     Fibroids, ovaries intact, age 55  . Bladder repair     With hyst  . Appendectomy   . Cataract extraction     Implant  . Dexa 06/2000    Slightly improved osteopenia  . Spine surgery 07/2000    Compound fracture sping - T9, L1  . Fracture surgery     L2, L3 compoud fracture in the past  . Cardiolite 08/2001    Neg EF 65%  . Dexa 01/2003    OP at femeral neck  . Colonoscopy 02/2004    Divertics, hems  . Dexa 01/2005    Osteopenia at femoral neck  . Foot surgery 2007  . Fetal blood transfusion 10/2005  . Esophagogastroduodenoscopy 11/2005    Gastic ulcer  . Esophagogastroduodenoscopy 02/2006    Schatzki ring, HH, mucosal abnormalities, Bx H pylori  . Esophagogastroduodenoscopy  01/2008    Schatzki ring, HH    PT Assessment/Plan/Recommendation PT Assessment Clinical Impression Statement: Pt is an 77 y/o female admitted with fall resulting in right hip fracture along with the below PT problem list.  Pt would benefit from acute PT to decrease burden of care and facilitate d/c to SNF. PT Recommendation/Assessment: Patient will need skilled PT in the acute care venue PT Problem List: Decreased strength;Decreased range of motion;Decreased activity tolerance;Decreased balance;Decreased mobility;Decreased cognition;Pain;Decreased knowledge of use of DME;Decreased knowledge of precautions Barriers to Discharge: None PT Therapy Diagnosis : Acute pain;Difficulty walking PT Plan PT Frequency: Min 3X/week PT Treatment/Interventions: DME instruction;Gait training;Functional mobility training;Therapeutic activities;Therapeutic exercise;Balance training;Cognitive remediation;Patient/family education PT Recommendation Follow Up Recommendations: Skilled nursing facility Equipment Recommended: Defer to next venue PT Goals  Acute Rehab PT Goals PT Goal Formulation: With patient Time For Goal Achievement: 2 weeks Pt will go Supine/Side to Sit: with min assist PT Goal: Supine/Side to Sit - Progress: Other (comment) (Set today.) Pt will go Sit to Supine/Side: with min assist PT Goal: Sit to Supine/Side - Progress: Other (comment) (Set today.) Pt will go Sit to Stand: with min assist PT Goal: Sit to Stand - Progress: Other (comment) (Set today.) Pt will go Stand  to Sit: with min assist PT Goal: Stand to Sit - Progress: Other (comment) (Set today.) Pt will Ambulate: 16 - 50 feet;with mod assist;with least restrictive assistive device PT Goal: Ambulate - Progress: Other (comment) (Set today.) Pt will Perform Home Exercise Program: with min assist PT Goal: Perform Home Exercise Program - Progress: Other (comment) (Set today.)  PT Evaluation Precautions/Restrictions   Precautions Precautions: Fall Required Braces or Orthoses: No Restrictions Weight Bearing Restrictions: Yes RLE Weight Bearing: Weight bearing as tolerated Other Position/Activity Restrictions: No extreme abduction right LE. Prior Functioning  Home Living Lives With: Alone (At ALF.) Receives Help From: Other (Comment) (ALF staff.) Type of Home: Assisted living Home Layout: One level Home Access: Level entry Home Adaptive Equipment: Walker - rolling Prior Function Level of Independence: Independent with basic ADLs;Independent with gait;Independent with transfers;Independent with homemaking with ambulation Cognition Cognition Arousal/Alertness: Awake/alert Overall Cognitive Status: Impaired Memory: Appears impaired Memory Deficits: Poor historian.  Question pt's description of d/c environment and PLOF.   Orientation Level: Oriented to person;Oriented to place;Oriented to situation Cognition - Other Comments: Slow to process information with difficulty problem solving. Sensation/Coordination Sensation Light Touch: Not tested Stereognosis: Not tested Hot/Cold: Not tested Proprioception: Not tested Coordination Gross Motor Movements are Fluid and Coordinated: Yes Fine Motor Movements are Fluid and Coordinated: Not tested Extremity Assessment RUE Assessment RUE Assessment: Not tested LUE Assessment LUE Assessment: Not tested RLE Assessment RLE Assessment: Exceptions to [facility] RLE AROM (degrees) Overall AROM Right Lower Extremity: Deficits;Due to pain RLE Overall AROM Comments: Generally limited by pain. RLE Strength RLE Overall Strength: Deficits;Due to pain RLE Overall Strength Comments: 2/5 LLE Assessment LLE Assessment: Within Functional Limits Pain 8/10 in right hip with ambulation.  Pt repositioned and pain subsided. Mobility (including Balance) Bed Mobility Bed Mobility: Yes Supine to Sit: 3: Mod assist;HOB flat Supine to Sit Details (indicate cue type and  reason): Assist for right LE and trunk to facilitate translation secondary to pain.  Verbal cues for sequence. Sit to Supine - Left: 2: Max assist;HOB flat Sit to Supine - Left Details (indicate cue type and reason): Assist to bilateral LEs (right>left) and trunk to facilitate motion to supine.  Cues for sequence to especially ensure no abduction of right LE. Transfers Transfers: Yes Sit to Stand: 3: Mod assist;From bed (2 trials.) Sit to Stand Details (indicate cue type and reason): Assist for translation of trunk over BOS with blocking to right LE.  Cues for safest hand placement. Stand to Sit: 3: Mod assist;To bed (2 trials.) Stand to Sit Details: Assist for control of eccentric descent to bed with max cues for hand/right LE placement. Ambulation/Gait Ambulation/Gait: Yes Ambulation/Gait Assistance: 2: Max assist Ambulation/Gait Assistance Details (indicate cue type and reason): Assist to shift weight right to off weight left LE for advancement with cues for sequence and safe use of RW.  Right LE buckling with stance phase. Ambulation Distance (Feet): 1 Feet Assistive device: Rolling walker Gait Pattern: Decreased step length - right;Decreased step length - left;Trunk flexed;Decreased stance time - right Stairs: No Wheelchair Mobility Wheelchair Mobility: No  Posture/Postural Control Posture/Postural Control: No significant limitations Balance Balance Assessed: No End of Session PT - End of Session Equipment Utilized During Treatment: Gait belt Activity Tolerance: Patient tolerated treatment well Patient left: in bed;with call bell in reach Nurse Communication: Mobility status for transfers;Mobility status for ambulation General Behavior During Session: [facility] for tasks performed Cognition: Impaired  Cephus Shelling 02/21/2011, 3:15 PM  02/21/2011 Cephus Shelling, PT,  DPT [phone]

## 2011-02-21 NOTE — Progress Notes (Signed)
Patient ID: Tiffany Austin, female   DOB: [birth year], 77 y.o.   MRN: [id number] Subjective: Patient reports No complaints  Objective: Vital signs in last 24 hours: Temp:  [97.8 F (36.6 C)-98.9 F (37.2 C)] 98.2 F (36.8 C) (12/09 0800) Pulse Rate:  [69-91] 73  (12/09 0500) Resp:  [12-20] 14  (12/09 0500) BP: (98-141)/(54-79) 125/62 mmHg (12/09 0500) SpO2:  [94 %-100 %] 100 % (12/09 0500) FiO2 (%):  [2 %] 2 % (12/09 0600) Weight:  [40.8 kg (89 lb 15.2 oz)] 89 lb 15.2 oz (40.8 kg) (12/08 1645)  Intake/Output from previous day: 12/08 0701 - 12/09 0700 In: 651.7 [P.O.:60; I.V.:591.7] Out: 750 [Urine:750] Intake/Output this shift:    Awake alert. Follows complex commands. Verbal appropriate  Lab Results:  [facility] 02/20/11 0916 02/20/11 0903  WBC -- 12.0*  HGB 13.6 12.7  HCT 40.0 38.9  PLT -- 118*   BMET  Basename 02/20/11 0916 02/20/11 0903  NA 139 137  K 4.2 4.1  CL 104 101  CO2 -- 27  GLUCOSE 148* 145*  BUN 21 21  CREATININE 0.90 0.77  CALCIUM -- 8.6    Studies/Results: Ct Head Wo Contrast  02/21/2011  *RADIOLOGY REPORT*  Clinical Data: Follow-up left frontal hemorrhage  CT HEAD WITHOUT CONTRAST  Technique:  Contiguous axial images were obtained from the base of the skull through the vertex without contrast.  Comparison: 06/13/2010  Findings: Motion artifact limits the study.  There is a left inferior frontal hematoma measuring about 2.8 x 4.2 cm.  This appears to represent intraparenchymal hematoma although an extra- axial collection is not entirely excluded.  Smaller rounded area of acute hemorrhage in the posterior left inferior temporal region measuring 1.2 cm.  Smaller focal intraparenchymal hemorrhage in the right periventricular white matter measuring 1.2 cm diameter. Subarachnoid hemorrhage in the right sulci.  Intraventricular hemorrhage with blood layering in the posterior ventricular horns. These changes are new since the previous comparison study from  06/13/2010.  Since the history indicates that this is a follow-up study, I am presuming that there is another outside study which is not available at the time of dictation.  There is some mass effect in the left frontal region with effacement of the anterior horn of the lateral ventricle.  Minimal midline shift.  Diffuse cerebral atrophy.  Ventricular dilatation consistent with central atrophy.  Low attenuation change in the deep white matter consistent small vessel ischemia.  No effacement of basal cisterns. Vascular calcifications.  No depressed skull fractures.  Visualized paranasal sinuses are not opacified.  IMPRESSION: Left anterior frontal, left posterior temporal, and right white matter hematomas.  Subarachnoid blood on the right. Intraventricular hemorrhage.  No significant mass effect or cerebral edema.  No recent comparison studies available.  Results discussed with the patient's nurse on 3100, [name], at 0446 hours on 02/21/2011.  Original Report Authenticated By: Marlon Pel, M.D.   Ct Abdomen Pelvis W Contrast  02/20/2011  *RADIOLOGY REPORT*  Clinical Data: Trauma.  Fall.  Acute versus chronic hip injury. Low back and groin pain.  CT ABDOMEN AND PELVIS WITH CONTRAST  Technique:  Multidetector CT imaging of the abdomen and pelvis was performed following the standard protocol during bolus administration of intravenous contrast.  Contrast: OMNIPAQUE IOHEXOL 300 MG/ML IV SOLN  Comparison: 11/28/2008.  Findings: Atelectasis and nonspecific interstitial changes are present at the lung bases.  Visualized portions of the heart appear within normal limits.  The liver and gallbladder unremarkable. Adrenal glands normal.  Normal renal enhancement and delayed excretion of contrast.  Markedly tortuous abdominal aorta with kinking at the level of the renal arteries.  Maximal diameter infrarenal abdominal aorta is 22 mm.  Foley catheter present in the urinary bladder with iatrogenic air.  No free fluid.   Colonic diverticulosis without diverticulitis.  Small bowel appears within normal limits.  Healed bilateral obturator ring fractures are present.  Diffuse osteopenia.  Sclerosis of the left sacral ala compatible with healed insufficiency fracture.  Chronic compression fractures of the thoracolumbar spine appear little changed compared to prior exam.  There is ankylosis of the L2-L3 and L4-L5.  L4 superior endplate compression fracture is unchanged.  No retropulsion.  The L2 superior endplate compression fracture is unchanged with mild retropulsion.  The L1 compression fracture appears similar as well.  Vertebral augmentation at T11 and T12.  Partially visualized T10 superior endplate compression fracture is likely chronic.  Per CMS PQRS reporting requirements (PQRS Measure 24): Given the patient's age of greater than 50 and the fracture site (hip, distal radius, or spine), the patient should be tested for osteoporosis using DXA, and the appropriate treatment considered based on the DXA results.  There is a right greater trochanteric fracture with minimal extension inferiorly.  The femoral neck appears intact.  Left proximal femur appears intact.  IMPRESSION: 1.  Acute right greater trochanter fracture. Extension of fracture plane into the base of the greater trochanter.  The femoral neck is intact.  In this osteopenic patient, MRI of the right hip may be useful to further assess the extent of intertrochanteric extension. 2.  Healed obturator ring fractures, and left sacral ala fracture. 3.  Chronic multilevel thoracolumbar compression fractures with lower thoracic vertebral augmentation.  4.  Atherosclerosis with markedly tortuous abdominal aorta.  Original Report Authenticated By: Andreas Newport, M.D.   Dg Chest Portable 1 View  02/20/2011  *RADIOLOGY REPORT*  Clinical Data: Syncope.  Fall.  Weakness.  Hypertension.  PORTABLE CHEST - 1 VIEW  Comparison: 06/13/2010.  Findings: There is no pneumothorax  identified.  Cardiopericardial silhouette is within normal limits for projection.  Upper thoracic vertebral augmentation.  No displaced rib fractures.  Emphysema is present with bilateral pleural apical scarring.  Osteopenia.  Lower thoracic vertebral augmentation is new compared to 06/13/2010. 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 Stable calcified granuloma in the left midlung.  IMPRESSION: No acute cardiopulmonary disease.  Probable underlying emphysema and bilateral pleural apical scarring.  Original Report Authenticated By: Andreas Newport, M.D.    Assessment/Plan: Stable. CT brain today shows no worsening of low left frontal hemorrhage. Will re check scan in a few days. Ok to increase activity  LOS: 1 day  As above   Reinaldo Meeker, MD 02/21/2011, 8:17 AM

## 2011-02-22 LAB — MRSA PCR SCREENING: MRSA by PCR: NEGATIVE

## 2011-02-22 MED ORDER — MEGESTROL ACETATE 400 MG/10ML PO SUSP
800.0000 mg | Freq: Every day | ORAL | Status: DC
  Administered 2011-02-22 – 2011-02-25 (×4): 800 mg via ORAL
  Filled 2011-02-22 (×4): qty 20

## 2011-02-22 NOTE — Progress Notes (Signed)
[name] Age: 77 y.o. Sex: female Date of Birth: 11-06-1924 Admit Date: 02/20/2011 [id number] Dayton Martes, MD, MD  Subjective: Very slow today-didn't want to be disturbed.  Objective: Vital signs in last 24 hours: Filed Vitals:   02/21/11 1400 02/21/11 2050 02/22/11 0531 02/22/11 1351  BP: 135/55 138/79 114/68 133/72  Pulse: 117 89 84 78  Temp:  99.2 F (37.3 C) 99.5 F (37.5 C) 99.7 F (37.6 C)  TempSrc:  Oral Oral   Resp: 14 18 18 16   Height:      Weight:      SpO2: 95% 99% 94% 100%    Weight change:   Body mass index is 15.44 kg/(m^2).  Intake/Output from previous day:  Intake/Output Summary (Last 24 hours) at 02/22/11 1603 Last data filed at 02/22/11 1300  Gross per 24 hour  Intake    450 ml  Output    300 ml  Net    150 ml    PHYSICAL EXAM: Gen Exam: very slow today-but awake-didn't want to be disturbed.  Neck: Supple, No JVD.   Chest: B/L Clear.   CVS: S1 S2 Regular, no murmurs.  Abdomen: soft, BS +, non tender, non distended.  Extremities: no edema, lower extremities warm to touch. Neurologic: Non Focal-difficult exam-not very cooperative-but finally moved all 4 ext to pain Skin: No Rash.   Wounds: N/A.    LAB RESULTS: CBC  Lab 02/20/11 0916 02/20/11 0903  WBC -- 12.0*  HGB 13.6 12.7  HCT 40.0 38.9  PLT -- 118*  MCV -- 91.7  MCH -- 30.0  MCHC -- 32.6  RDW -- 14.2  LYMPHSABS -- --  MONOABS -- --  EOSABS -- --  BASOSABS -- --  BANDABS -- --    Chemistries   Lab 02/20/11 0916 02/20/11 0903  NA 139 137  K 4.2 4.1  CL 104 101  CO2 -- 27  GLUCOSE 148* 145*  BUN 21 21  CREATININE 0.90 0.77  CALCIUM -- 8.6  MG -- --    GFR Estimated Creatinine Clearance: 30 ml/min (by C-G formula based on Cr of 0.9).  Coagulation profile  Lab 02/20/11 0903  INR 1.05  PROTIME --    Cardiac Enzymes No results found for this basename: CK:3,CKMB:3,TROPONINI:3,MYOGLOBIN:3 in the last 168 hours  No components found with  this basename: POCBNP:3 No results found for this basename: DDIMER:2 in the last 72 hours No results found for this basename: HGBA1C:2 in the last 72 hours No results found for this basename: CHOL:2,HDL:2,LDLCALC:2,TRIG:2,CHOLHDL:2,LDLDIRECT:2 in the last 72 hours No results found for this basename: TSH,T4TOTAL,FREET3,T3FREE,THYROIDAB in the last 72 hours No results found for this basename: VITAMINB12:2,FOLATE:2,FERRITIN:2,TIBC:2,IRON:2,RETICCTPCT:2 in the last 72 hours No results found for this basename: LIPASE:2,AMYLASE:2 in the last 72 hours  Urine Studies No results found for this basename: UACOL:2,UAPR:2,USPG:2,UPH:2,UTP:2,UGL:2,UKET:2,UBIL:2,UHGB:2,UNIT:2,UROB:2,ULEU:2,UEPI:2,UWBC:2,URBC:2,UBAC:2,CAST:2,CRYS:2,UCOM:2,BILUA:2 in the last 72 hours  MICROBIOLOGY: Recent Results (from the past 240 hour(s))  URINE CULTURE     Status: Normal   Collection Time   02/20/11 11:48 AM      Component Value Range Status Comment   Specimen Description URINE, CATHETERIZED   Final    Special Requests NONE   Final    Setup Time [id number]   Final    Colony Count NO GROWTH   Final    Culture NO GROWTH   Final    Report Status 02/21/2011 FINAL   Final   MRSA PCR SCREENING     Status: Normal   Collection Time  02/22/11  1:04 PM      Component Value Range Status Comment   MRSA by PCR NEGATIVE  NEGATIVE  Final     RADIOLOGY STUDIES/RESULTS: Ct Head Wo Contrast  02/21/2011  *RADIOLOGY REPORT*  Clinical Data: Follow-up left frontal hemorrhage  CT HEAD WITHOUT CONTRAST  Technique:  Contiguous axial images were obtained from the base of the skull through the vertex without contrast.  Comparison: 06/13/2010  Findings: Motion artifact limits the study.  There is a left inferior frontal hematoma measuring about 2.8 x 4.2 cm.  This appears to represent intraparenchymal hematoma although an extra- axial collection is not entirely excluded.  Smaller rounded area of acute hemorrhage in the posterior left  inferior temporal region measuring 1.2 cm.  Smaller focal intraparenchymal hemorrhage in the right periventricular white matter measuring 1.2 cm diameter. Subarachnoid hemorrhage in the right sulci.  Intraventricular hemorrhage with blood layering in the posterior ventricular horns. These changes are new since the previous comparison study from 06/13/2010.  Since the history indicates that this is a follow-up study, I am presuming that there is another outside study which is not available at the time of dictation.  There is some mass effect in the left frontal region with effacement of the anterior horn of the lateral ventricle.  Minimal midline shift.  Diffuse cerebral atrophy.  Ventricular dilatation consistent with central atrophy.  Low attenuation change in the deep white matter consistent small vessel ischemia.  No effacement of basal cisterns. Vascular calcifications.  No depressed skull fractures.  Visualized paranasal sinuses are not opacified.  IMPRESSION: Left anterior frontal, left posterior temporal, and right white matter hematomas.  Subarachnoid blood on the right. Intraventricular hemorrhage.  No significant mass effect or cerebral edema.  No recent comparison studies available.  Results discussed with the patient's nurse on 3100, [name], at 0446 hours on 02/21/2011.  Original Report Authenticated By: Marlon Pel, M.D.   Ct Abdomen Pelvis W Contrast  02/20/2011  *RADIOLOGY REPORT*  Clinical Data: Trauma.  Fall.  Acute versus chronic hip injury. Low back and groin pain.  CT ABDOMEN AND PELVIS WITH CONTRAST  Technique:  Multidetector CT imaging of the abdomen and pelvis was performed following the standard protocol during bolus administration of intravenous contrast.  Contrast: OMNIPAQUE IOHEXOL 300 MG/ML IV SOLN  Comparison: 11/28/2008.  Findings: Atelectasis and nonspecific interstitial changes are present at the lung bases.  Visualized portions of the heart appear within normal limits.   The liver and gallbladder unremarkable. Adrenal glands normal.  Normal renal enhancement and delayed excretion of contrast.  Markedly tortuous abdominal aorta with kinking at the level of the renal arteries.  Maximal diameter infrarenal abdominal aorta is 22 mm.  Foley catheter present in the urinary bladder with iatrogenic air.  No free fluid.  Colonic diverticulosis without diverticulitis.  Small bowel appears within normal limits.  Healed bilateral obturator ring fractures are present.  Diffuse osteopenia.  Sclerosis of the left sacral ala compatible with healed insufficiency fracture.  Chronic compression fractures of the thoracolumbar spine appear little changed compared to prior exam.  There is ankylosis of the L2-L3 and L4-L5.  L4 superior endplate compression fracture is unchanged.  No retropulsion.  The L2 superior endplate compression fracture is unchanged with mild retropulsion.  The L1 compression fracture appears similar as well.  Vertebral augmentation at T11 and T12.  Partially visualized T10 superior endplate compression fracture is likely chronic.  Per CMS PQRS reporting requirements (PQRS Measure 24): Given the patient's  age of greater than 50 and the fracture site (hip, distal radius, or spine), the patient should be tested for osteoporosis using DXA, and the appropriate treatment considered based on the DXA results.  There is a right greater trochanteric fracture with minimal extension inferiorly.  The femoral neck appears intact.  Left proximal femur appears intact.  IMPRESSION: 1.  Acute right greater trochanter fracture. Extension of fracture plane into the base of the greater trochanter.  The femoral neck is intact.  In this osteopenic patient, MRI of the right hip may be useful to further assess the extent of intertrochanteric extension. 2.  Healed obturator ring fractures, and left sacral ala fracture. 3.  Chronic multilevel thoracolumbar compression fractures with lower thoracic vertebral  augmentation.  4.  Atherosclerosis with markedly tortuous abdominal aorta.  Original Report Authenticated By: Andreas Newport, M.D.   Dg Chest Portable 1 View  02/20/2011  *RADIOLOGY REPORT*  Clinical Data: Syncope.  Fall.  Weakness.  Hypertension.  PORTABLE CHEST - 1 VIEW  Comparison: 06/13/2010.  Findings: There is no pneumothorax identified.  Cardiopericardial silhouette is within normal limits for projection.  Upper thoracic vertebral augmentation.  No displaced rib fractures.  Emphysema is present with bilateral pleural apical scarring.  Osteopenia.  Lower thoracic vertebral augmentation is new compared to 06/13/2010. 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 Stable calcified granuloma in the left midlung.  IMPRESSION: No acute cardiopulmonary disease.  Probable underlying emphysema and bilateral pleural apical scarring.  Original Report Authenticated By: Andreas Newport, M.D.    MEDICATIONS: Scheduled Meds:    . calcium-vitamin D  1 tablet Oral Daily  . docusate sodium  100 mg Oral BID  . dronabinol  2.5 mg Oral BID AC  . Ensure Plus  1 Can Oral BID  . famotidine  20 mg Oral BID  . fentaNYL  25 mcg Transdermal Q72H  . levothyroxine  25 mcg Oral Daily  . LORazepam  0.5 mg Oral QHS  . megestrol  800 mg Oral Daily  . mirtazapine  15 mg Oral QHS  .  morphine injection  4 mg Intravenous Once  . multivitamins ther. w/minerals  1 tablet Oral Daily  . pantoprazole  40 mg Oral Q1200  . senna-docusate  2 tablet Oral QHS  . Vitamin D (Ergocalciferol)  50,000 Units Oral Q30 days   Continuous Infusions:    . dextrose 5 % and 0.9% NaCl 40 mL/hr at 02/21/11 1524   PRN Meds:.HYDROcodone-acetaminophen, HYDROcodone-acetaminophen, HYDROcodone-acetaminophen, loratadine, meclizine, morphine, ondansetron  Antibiotics: Anti-infectives    None     Assessment/Plan:  Fall  -Not sure whether this was a  syncopal episode  -No arrhythmias seen on telemetry so far  -Awaiting 2-D echo-but  doubt this is unchanged overall course. She appears to be failing to thrive  Failure to thrive -Very poor oral intake -Gen. decline in health apparently has been going on for a while. -Can try  Megace, we'll get nutrition to see her. -Also suggest a palliative care evaluation.  Subdural hematoma -Neurosurgery following   Right femoral fracture -Orthopedics following  Hypothyroidism -Continue with levothyroxine  Osteoporosis -Resume bisphosphonates on discharge. Continue with vitamin D and calcium supplementation. -Will need outpatient yearly DEXA scan.  GERD -Continue with PPI  Disposition: Would likely need SNF at discharge  Rest per primary service-hospitalist service will follow from periphery-recommend a palliative care evaluation  Maretta Bees,  MD. 02/22/2011, 4:03 PM

## 2011-02-22 NOTE — Progress Notes (Signed)
Patient ID: Tiffany Austin, female   DOB: 1924/10/23, 77 y.o.   MRN: [id number] Subjective: Patient reports Limited asppetite  Objective: Vital signs in last 24 hours: Temp:  [97.7 F (36.5 C)-99.5 F (37.5 C)] 99.5 F (37.5 C) (12/10 0531) Pulse Rate:  [84-117] 84  (12/10 0531) Resp:  [14-18] 18  (12/10 0531) BP: (109-138)/(55-79) 114/68 mmHg (12/10 0531) SpO2:  [94 %-99 %] 94 % (12/10 0531)  Intake/Output from previous day: 12/09 0701 - 12/10 0700 In: 785.8 [I.V.:785.8] Out: 500 [Urine:500] Intake/Output this shift:    No new changes  Lab Results:  [facility] 02/20/11 0916 02/20/11 0903  WBC -- 12.0*  HGB 13.6 12.7  HCT 40.0 38.9  PLT -- 118*   BMET  Basename 02/20/11 0916 02/20/11 0903  NA 139 137  K 4.2 4.1  CL 104 101  CO2 -- 27  GLUCOSE 148* 145*  BUN 21 21  CREATININE 0.90 0.77  CALCIUM -- 8.6    Studies/Results: Ct Head Wo Contrast  02/21/2011  *RADIOLOGY REPORT*  Clinical Data: Follow-up left frontal hemorrhage  CT HEAD WITHOUT CONTRAST  Technique:  Contiguous axial images were obtained from the base of the skull through the vertex without contrast.  Comparison: 06/13/2010  Findings: Motion artifact limits the study.  There is a left inferior frontal hematoma measuring about 2.8 x 4.2 cm.  This appears to represent intraparenchymal hematoma although an extra- axial collection is not entirely excluded.  Smaller rounded area of acute hemorrhage in the posterior left inferior temporal region measuring 1.2 cm.  Smaller focal intraparenchymal hemorrhage in the right periventricular white matter measuring 1.2 cm diameter. Subarachnoid hemorrhage in the right sulci.  Intraventricular hemorrhage with blood layering in the posterior ventricular horns. These changes are new since the previous comparison study from 06/13/2010.  Since the history indicates that this is a follow-up study, I am presuming that there is another outside study which is not available at the time of  dictation.  There is some mass effect in the left frontal region with effacement of the anterior horn of the lateral ventricle.  Minimal midline shift.  Diffuse cerebral atrophy.  Ventricular dilatation consistent with central atrophy.  Low attenuation change in the deep white matter consistent small vessel ischemia.  No effacement of basal cisterns. Vascular calcifications.  No depressed skull fractures.  Visualized paranasal sinuses are not opacified.  IMPRESSION: Left anterior frontal, left posterior temporal, and right white matter hematomas.  Subarachnoid blood on the right. Intraventricular hemorrhage.  No significant mass effect or cerebral edema.  No recent comparison studies available.  Results discussed with the patient's nurse on 3100, [name], at 0446 hours on 02/21/2011.  Original Report Authenticated By: Marlon Pel, M.D.   Ct Abdomen Pelvis W Contrast  02/20/2011  *RADIOLOGY REPORT*  Clinical Data: Trauma.  Fall.  Acute versus chronic hip injury. Low back and groin pain.  CT ABDOMEN AND PELVIS WITH CONTRAST  Technique:  Multidetector CT imaging of the abdomen and pelvis was performed following the standard protocol during bolus administration of intravenous contrast.  Contrast: OMNIPAQUE IOHEXOL 300 MG/ML IV SOLN  Comparison: 11/28/2008.  Findings: Atelectasis and nonspecific interstitial changes are present at the lung bases.  Visualized portions of the heart appear within normal limits.  The liver and gallbladder unremarkable. Adrenal glands normal.  Normal renal enhancement and delayed excretion of contrast.  Markedly tortuous abdominal aorta with kinking at the level of the renal arteries.  Maximal diameter infrarenal abdominal aorta is  22 mm.  Foley catheter present in the urinary bladder with iatrogenic air.  No free fluid.  Colonic diverticulosis without diverticulitis.  Small bowel appears within normal limits.  Healed bilateral obturator ring fractures are present.  Diffuse  osteopenia.  Sclerosis of the left sacral ala compatible with healed insufficiency fracture.  Chronic compression fractures of the thoracolumbar spine appear little changed compared to prior exam.  There is ankylosis of the L2-L3 and L4-L5.  L4 superior endplate compression fracture is unchanged.  No retropulsion.  The L2 superior endplate compression fracture is unchanged with mild retropulsion.  The L1 compression fracture appears similar as well.  Vertebral augmentation at T11 and T12.  Partially visualized T10 superior endplate compression fracture is likely chronic.  Per CMS PQRS reporting requirements (PQRS Measure 24): Given the patient's age of greater than 50 and the fracture site (hip, distal radius, or spine), the patient should be tested for osteoporosis using DXA, and the appropriate treatment considered based on the DXA results.  There is a right greater trochanteric fracture with minimal extension inferiorly.  The femoral neck appears intact.  Left proximal femur appears intact.  IMPRESSION: 1.  Acute right greater trochanter fracture. Extension of fracture plane into the base of the greater trochanter.  The femoral neck is intact.  In this osteopenic patient, MRI of the right hip may be useful to further assess the extent of intertrochanteric extension. 2.  Healed obturator ring fractures, and left sacral ala fracture. 3.  Chronic multilevel thoracolumbar compression fractures with lower thoracic vertebral augmentation.  4.  Atherosclerosis with markedly tortuous abdominal aorta.  Original Report Authenticated By: Andreas Newport, M.D.    Assessment/Plan: Stable. Will re check CT brain tomorrow  LOS: 2 days  As above   Reinaldo Meeker, MD 02/22/2011, 9:33 AM

## 2011-02-22 NOTE — Progress Notes (Signed)
Likely SNF but a palliative care situation may be appropriate Patient examined and I agree with the assessment and plan  Tailyn Hantz E

## 2011-02-22 NOTE — Progress Notes (Signed)
Occupational Therapy Evaluation [name] MRN: [id number] DOB: 05/17/1924 Today's Date: 02/22/2011 11:33-11:58 evII Problem List:  Patient Active Problem List  Diagnoses  . HELICOBACTER PYLORI GASTRITIS  . HYPOTHYROIDISM  . HYPERKALEMIA  . ANEMIA OF CHRONIC DISEASE  . DEPRESSION  . PERIPHERAL NEUROPATHY  . LBBB  . GERD  . PEPTIC ULCER DISEASE  . UTI  . DEGENERATIVE DISC DISEASE, LUMBAR SPINE  . BACK PAIN, THORACIC REGION  . BACK PAIN, CHRONIC  . OSTEOPOROSIS  . DIZZINESS, CHRONIC  . Other Malaise and Fatigue  . INCONTINENCE  . VERTEBRAL FRACTURE, THORACIC SPINE  . FRACTURE, PELVIS  . CLOSED FRACTURE OF UNSPECIFIED BONE OF FOOT  . SKIN CANCER, HX OF  . Decubitus ulcer  . Vertigo  . Failure to thrive in adult  . Fall  . Subdural hemorrhage following injury  . Right femur fx    Past Medical History:  Past Medical History  Diagnosis Date  . GERD (gastroesophageal reflux disease)   . Osteoporosis   . Peptic ulcer disease   . Anemia of chronic disease   . Iron deficiency   . Peripheral neuropathy     Symptoms of  . Varicose veins   . Dizziness     chronic   Past Surgical History:  Past Surgical History  Procedure Date  . Tonsillectomy   . Abdominal hysterectomy     Fibroids, ovaries intact, age 49  . Bladder repair     With hyst  . Appendectomy   . Cataract extraction     Implant  . Dexa 06/2000    Slightly improved osteopenia  . Spine surgery 07/2000    Compound fracture sping - T9, L1  . Fracture surgery     L2, L3 compoud fracture in the past  . Cardiolite 08/2001    Neg EF 65%  . Dexa 01/2003    OP at femeral neck  . Colonoscopy 02/2004    Divertics, hems  . Dexa 01/2005    Osteopenia at femoral neck  . Foot surgery 2007  . Fetal blood transfusion 10/2005  . Esophagogastroduodenoscopy 11/2005    Gastic ulcer  . Esophagogastroduodenoscopy 02/2006    Schatzki ring, HH, mucosal abnormalities, Bx H pylori  .  Esophagogastroduodenoscopy 01/2008    Schatzki ring, HH    OT Assessment/Plan/Recommendation OT Assessment Clinical Impression Statement: 77 year old female admitted after fall sustaining a right hip fracture and L SDH.  Currently presents with decreased attention, decreased initiation, decreased awareness, decreased strength and overall decreased independence with all basic selfcare tasks.  Needs total assist for all functional ADLS.   Feel she will benefit from acute OT services to address current deficits with ADLs and cognition.  Will likely need SNF for follow-up rehab.  Feel assistive living will not be able to provide constant 24 hour assist for d/c.   OT Recommendation/Assessment: Patient will need skilled OT in the acute care venue OT Problem List: Decreased strength;Impaired balance (sitting and/or standing);Decreased cognition;Decreased safety awareness;Decreased knowledge of use of DME or AE Barriers to Discharge: Decreased caregiver support OT Therapy Diagnosis : Generalized weakness;Cognitive deficits;Acute pain OT Plan OT Frequency: Min 2X/week OT Treatment/Interventions: Self-care/ADL training;Therapeutic activities;Cognitive remediation/compensation;Energy conservation;DME and/or AE instruction;Patient/family education;Balance training OT Recommendation Recommendations for Other Services: Speech consult Follow Up Recommendations: Skilled nursing facility Equipment Recommended: Defer to next venue Individuals Consulted Consulted and Agree with Results and Recommendations: Patient unable/family or caregiver not available OT Goals Acute Rehab OT Goals OT Goal Formulation:  Patient unable to participate in goal setting ADL Goals Pt Will Perform Eating: with set-up;Sitting, chair;with cueing (comment type and amount) (No more than min instructional cueing.) ADL Goal: Eating - Progress: Progressing toward goals Pt Will Perform Grooming: with supervision;with cueing (comment type  and amount);Unsupported;Sitting, edge of bed (2 tasks with no more than min instructional cueing.) ADL Goal: Grooming - Progress: Progressing toward goals Pt Will Transfer to Toilet: with min assist;3-in-1;Stand pivot transfer ADL Goal: Toilet Transfer - Progress: Progressing toward goals Miscellaneous OT Goals Miscellaneous OT Goal #1: Pt will maintain sustained attention for 15 mins with no more than min instructional cues during selfcare tasks. OT Goal: Miscellaneous Goal #1 - Progress: Progressing toward goals  OT Evaluation Precautions/Restrictions  Precautions Precautions: Fall Required Braces or Orthoses: No Restrictions Weight Bearing Restrictions: No RLE Weight Bearing: Weight bearing as tolerated Other Position/Activity Restrictions: No extreme abduction right LE. Prior Functioning Home Living Lives With: Alone (At ALF.) Receives Help From: Other (Comment) (ALF staff.) Type of Home: Assisted living Home Layout: One level Home Access: Level entry Home Adaptive Equipment: Walker - rolling Prior Function Level of Independence: Independent with basic ADLs;Independent with gait;Independent with transfers;Independent with homemaking with ambulation ADL ADL Eating/Feeding: Simulated;+1 Total assistance Where Assessed - Eating/Feeding: Bed level Grooming: Performed;Wash/dry face;Maximal assistance (pt needed hand over hand assist to initiate washing ) Where Assessed - Grooming: Sitting, bed Upper Body Bathing: Simulated;Maximal assistance Where Assessed - Upper Body Bathing: Sitting, bed;Unsupported Lower Body Bathing: +2 Total assistance Lower Body Bathing Details (indicate cue type and reason): Pt 30% Where Assessed - Lower Body Bathing: Sit to stand from bed Upper Body Dressing: Simulated;+1 Total assistance Where Assessed - Upper Body Dressing: Sitting, bed Lower Body Dressing: Simulated;+2 Total assistance Lower Body Dressing Details (indicate cue type and reason): pt  30 % Where Assessed - Lower Body Dressing: Sit to stand from bed Toilet Transfer: Performed;Maximal assistance Toilet Transfer Method: Stand pivot Toilet Transfer Equipment: Bedside commode (Commode placed beside of bed.) Toileting - Clothing Manipulation: Performed;+2 Total assistance Toileting - Clothing Manipulation Details (indicate cue type and reason): pt 30% Where Assessed - Toileting Clothing Manipulation: Sit to stand from 3-in-1 or toilet Toileting - Hygiene: Performed;+2 Total assistance Toileting - Hygiene Details (indicate cue type and reason): pt 30% Where Assessed - Toileting Hygiene: Sit to stand from 3-in-1 or toilet Tub/Shower Transfer: Not assessed Tub/Shower Transfer Method: Not assessed Equipment Used:  (bedside commode) ADL Comments: Pt with no initiation of any task performed.  Required max assist to place washcloth in her right hand and assist her with washing her face.  Total assist for all bed mobility and scooting EOB.  Once therapist helped pt initiate standing she was able to assist.  Noted that pt was incontinent of bowel at begining of session.. Vision/Perception  Vision - History Baseline Vision:  (Unable to accurrately assess secondary to pts cognition.) Cognition Cognition Arousal/Alertness: Lethargic Overall Cognitive Status: Impaired Memory: Appears impaired Orientation Level: Other (Comment) (Pt non-verbal throughtout session.) Following Commands: Other (comment) (pt unable to follow any one step commands.) Cognition - Other Comments: Pt overall with no initiation throughout session.  Needs total to max demonstrational cues to perform any tasks.   Sensation/Coordination Sensation Light Touch: Not tested Extremity Assessment RUE Assessment RUE Assessment: Not tested (Pt moving UEs spontaneously but unable to assess) LUE Assessment LUE Assessment: Not tested (Pt moving UEs spontaneously but unable to assess) Mobility  Bed Mobility Bed Mobility:  Yes Right Sidelying to Sit: 1: +1  Total assist Transfers Transfers: Yes Sit to Stand: 2: Max assist;From chair/3-in-1;Without upper extremity assist Exercises   End of Session OT - End of Session Equipment Utilized During Treatment:  (bedside toilet) Activity Tolerance: Other (comment) (Pt limited secondary  to cognition and initiation.) Patient left: in bed;with call bell in reach General Behavior During Session: Lethargic Cognition: Impaired Cognitive Impairment: Pt with poor attention, initiation during session.  Would not verbally respond to any questions.   Lyllian Gause OTR/L 02/22/2011, 12:22 PM  Pager number [phone]

## 2011-02-22 NOTE — Progress Notes (Signed)
Clinical Social Work began placement process.  Assessment in shadow chart for review and updated FL2 was also completed and need MD to sign.  Will fax patient out for higher level of care since patient will not be able to return to ALF due to multiple falls.  Have attempted to meet with patient, but was not successful in getting information, thus daughter was contacted via phone and left message.  Awaiting returned call and will continue with placement process.    Ashley Jacobs, MSW LCSWA [phone]

## 2011-02-22 NOTE — Progress Notes (Signed)
Patient ID: Tiffany Austin, female   DOB: 08/05/1924, 77 y.o.   MRN: [id number]   LOS: 2 days   Subjective: Pt basically non verbal today, but nods head yes/no to answer questions. She does follow simple motor commands. She took her medications today, but is basically eating nothing of trays and refuses Ensure. She is continuing on IVF's at 34ml/hr.  Objective: Vital signs in last 24 hours: Temp:  [97.7 F (36.5 C)-99.5 F (37.5 C)] 99.5 F (37.5 C) (12/10 0531) Pulse Rate:  [84-117] 84  (12/10 0531) Resp:  [14-18] 18  (12/10 0531) BP: (109-138)/(55-79) 114/68 mmHg (12/10 0531) SpO2:  [94 %-99 %] 94 % (12/10 0531)   *RADIOLOGY REPORT*  Clinical Data: Follow-up left frontal hemorrhage  CT HEAD WITHOUT CONTRAST  Technique: Contiguous axial images were obtained from the base of  the skull through the vertex without contrast.  Comparison: 06/13/2010  Findings: Motion artifact limits the study. There is a left  inferior frontal hematoma measuring about 2.8 x 4.2 cm. This  appears to represent intraparenchymal hematoma although an extra-  axial collection is not entirely excluded. Smaller rounded area of  acute hemorrhage in the posterior left inferior temporal region  measuring 1.2 cm. Smaller focal intraparenchymal hemorrhage in the  right periventricular white matter measuring 1.2 cm diameter.  Subarachnoid hemorrhage in the right sulci. Intraventricular  hemorrhage with blood layering in the posterior ventricular horns.  These changes are new since the previous comparison study from  06/13/2010. Since the history indicates that this is a follow-up  study, I am presuming that there is another outside study which is  not available at the time of dictation.  There is some mass effect in the left frontal region with  effacement of the anterior horn of the lateral ventricle. Minimal  midline shift.  Diffuse cerebral atrophy. Ventricular dilatation consistent with  central atrophy.  Low attenuation change in the deep white matter  consistent small vessel ischemia. No effacement of basal cisterns.  Vascular calcifications. No depressed skull fractures. Visualized  paranasal sinuses are not opacified.  IMPRESSION:  Left anterior frontal, left posterior temporal, and right white  matter hematomas. Subarachnoid blood on the right.  Intraventricular hemorrhage. No significant mass effect or  cerebral edema. No recent comparison studies available.  Results discussed with the patient's nurse on 3100, [name], at 0446  hours on 02/21/2011.  Original Report Authenticated By: Marlon Pel, M.D.  General appearance: alert, cachectic and no distress, non verbal, but F/C as noted above Resp: clear to auscultation bilaterally Cardio: regular rate and rhythm GI: normal findings: soft, non-tender and abnormal findings:  hypoactive bowel sounds   Assessment/Plan: Fall TBI w/SDH -- Follow up Head CT tomorrow per Dr. Gerlene Fee Right hip fx -- WBAT Multiple medical problems -- per IM. Stable. FEN -- Maintaining hydration with IVF, will follow up tomorrow. VTE -- SCD's Dispo -- Soper discussion with daughter who is at bedside this am. She reports that pt has not been as conversant since this last fall.             The pt has had Mcginnis standing issues with eating and this seems worse with this episode. The daughter reports that she has a living will and that the pt does not want tube feeds. I discussed with the daughter that the pt would likely need SNF placement, but she will need to take po's and drink fluids well enough to maintain her hydration status.  [name],PA-C Pager [phone]  General Trauma Pager [phone]   02/22/2011

## 2011-02-23 ENCOUNTER — Inpatient Hospital Stay (HOSPITAL_COMMUNITY): Payer: Medicare Other

## 2011-02-23 ENCOUNTER — Encounter (HOSPITAL_COMMUNITY): Payer: Self-pay | Admitting: Radiology

## 2011-02-23 LAB — CBC
HCT: 38.1 % (ref 36.0–46.0)
Hemoglobin: 13 g/dL (ref 12.0–15.0)
RBC: 4.24 MIL/uL (ref 3.87–5.11)
RDW: 13.5 % (ref 11.5–15.5)
WBC: 10.8 10*3/uL — ABNORMAL HIGH (ref 4.0–10.5)

## 2011-02-23 LAB — BASIC METABOLIC PANEL
BUN: 10 mg/dL (ref 6–23)
Chloride: 103 mEq/L (ref 96–112)
GFR calc Af Amer: >90 mL/min (ref >90)
Potassium: 3.5 mEq/L (ref 3.5–5.1)

## 2011-02-23 MED ORDER — WHITE PETROLATUM GEL
Status: AC
  Filled 2011-02-23: qty 5

## 2011-02-23 NOTE — Progress Notes (Signed)
Physical Therapy Treatment [name] MRN: [id number] DOB: 1924-10-20 Today's Date: 02/23/2011  PT Assessment/Plan  PT - Assessment/Plan PT Plan: Discharge plan remains appropriate PT Frequency: Min 3X/week Follow Up Recommendations: Skilled nursing facility Equipment Recommended: Defer to next venue PT Goals  Acute Rehab PT Goals PT Goal: Supine/Side to Sit - Progress: Progressing toward goal PT Goal: Sit to Stand - Progress: Progressing toward goal PT Goal: Stand to Sit - Progress: Progressing toward goal PT Goal: Ambulate - Progress: Not met PT Goal: Perform Home Exercise Program - Progress: Not met  PT Treatment Precautions/Restrictions  Precautions Precautions: Fall Required Braces or Orthoses: No Restrictions Weight Bearing Restrictions: No RLE Weight Bearing: Weight bearing as tolerated Other Position/Activity Restrictions: No extreme abduction right LE. Mobility (including Balance) Bed Mobility Supine to Sit: 1: +1 Total assist;HOB flat Supine to Sit Details (indicate cue type and reason): A for all aspects of transfer. Pt unable to assist today.  Transfers Squat Pivot Transfers: 1: +1 Total assist;Patient percentage (comment) (pt = less than 10%) Squat Pivot Transfer Details (indicate cue type and reason): A to initiate stand and balance. A to position hips toward chair. Cues throughout for sequency. Pt unable to stand fully upright Ambulation/Gait Ambulation/Gait: No  Balance Balance Assessed: Yes Static Sitting Balance Static Sitting - Balance Support: Bilateral upper extremity supported;Feet supported Static Sitting - Level of Assistance: 4: Min assist Static Sitting - Comment/# of Minutes: Patient sat EOB ~ 5 min with B UE assist. Cues for position. Pt with LOB without UE support Exercise    End of Session PT - End of Session Equipment Utilized During Treatment: Gait belt Activity Tolerance: Patient limited by pain;Treatment  limited secondary to medical complications (Comment) Patient left: in chair;with call bell in reach;with family/visitor present Nurse Communication: Mobility status for transfers General Behavior During Session: Flat affect Cognition: Impaired Cognitive Impairment: Pt has difficulty following one step commands. Focused attention. Pt answering questions with one to two word answers ~25% of time.  Fredrich Birks 02/23/2011, 12:50 PM 02/23/2011 Fredrich Birks PTA [phone] pager [phone] office

## 2011-02-23 NOTE — Progress Notes (Signed)
Patient ID: Tiffany Austin, female   DOB: 02/27/1925, 77 y.o.   MRN: [id number] Subjective: Patient reports Not conversant  Objective: Vital signs in last 24 hours: Temp:  [98.1 F (36.7 C)-99.7 F (37.6 C)] 98.2 F (36.8 C) (12/11 0600) Pulse Rate:  [78-106] 98  (12/11 0600) Resp:  [16-20] 20  (12/11 0600) BP: (102-136)/(62-78) 136/78 mmHg (12/11 0600) SpO2:  [92 %-100 %] 93 % (12/11 0600)  Intake/Output from previous day: 12/10 0701 - 12/11 0700 In: 1090 [P.O.:10; I.V.:1080] Out: -  Intake/Output this shift: Total I/O In: 120 [P.O.:120] Out: -   Eyes are open to name. Mumbles a few words. Does not follow commands.  Lab Results:  [facility] 02/23/11 0640  WBC 10.8*  HGB 13.0  HCT 38.1  PLT 99*   BMET  Basename 02/23/11 0640  NA 135  K 3.5  CL 103  CO2 23  GLUCOSE 100*  BUN 10  CREATININE 0.65  CALCIUM 7.5*    Studies/Results: Ct Head Wo Contrast  02/23/2011  *RADIOLOGY REPORT*  Clinical Data: Follow-up head injury.Fell at facility.  CT HEAD WITHOUT CONTRAST  Technique:  Contiguous axial images were obtained from the base of the skull through the vertex without contrast.  Comparison: 02/21/2011.  Findings: There is worsening left frontal intracerebral hemorrhage when compared with 02/21/2011.  Cross-sectional measurements today are 42 x 40 mm with slight increase surrounding edema.  Measured at the level of the septum pellucidum, there is 4 mm of left to right shift.  Both of these are increased from priors.  There is a roughly stable left posterior temporal hemorrhage measuring 9 x 14 mm. Subtle left peritentorial subdural hematoma appears slightly more prominent along its mastoid attachment.  Layering intraventricular hemorrhage is redemonstrated and stable.  Atrophy with chronic microvascular ischemic change again noted.  No calvarial fracture.  No sinus or mastoid fluid.  IMPRESSION: The dominant abnormality is worsening of the left frontal intracerebral hematoma,  now measuring 40 x 42 mm. Increase surrounding edema.  Early 4 mm left to right shift.  Slight worsening left posterior temporal subdural and parenchymal bleed. Trauma Service aware.  Original Report Authenticated By: Elsie Stain, M.D.    Assessment/Plan: CT with some increasing edema around the hematoma. Patient somewhat less responsive and interactive. Family originally said they would not allow any major surgery like a craniotomy. I will hopefully talk to them again soon to reconfirm that decision. She is not yet in the place where that would need to be considered....but she is definitely not doing as well.  LOS: 3 days  As above   Reinaldo Meeker, MD 02/23/2011, 1:23 PM

## 2011-02-23 NOTE — Progress Notes (Signed)
Clinical Social Worker spoke with patient daughter over the phone to discuss patient discharge plans.  Patient daughter states that patient was living at an Assisted Living Facility in [place] prior to hospitalization.  Patient daughter also confirmed that patient had been a resident at [facility] skilled nursing facility in the past.    Per chart, patient now requiring SNF level of care.  Patient daughter preference is for [facility].  Clinical Social Worker initiated referral to [facility] and left a message with admissions coordinator to discuss bed availability.  CSW will follow up with patient daughter to provide bed availability once patient medically ready for discharge.    [address] [place], [place] [phone]

## 2011-02-23 NOTE — Progress Notes (Signed)
  Echocardiogram 2D Echocardiogram has been performed.  Mercy Moore 02/23/2011, 2:08 PM

## 2011-02-23 NOTE — Progress Notes (Signed)
D/W Dr. Gerlene Fee - family had relayed to him they would not want the patient to undergo crani.  Fortunately, patient MS is a bit better.  Plan to assist with PO's and hope for SNF placement. Patient examined and I agree with the assessment and plan  Young Mulvey E

## 2011-02-23 NOTE — Progress Notes (Signed)
[name] Age: 77 y.o. Sex: female Date of Birth: Sep 04, 1924 Admit Date: 02/20/2011 WUJ:[id number] Dayton Martes, MD, MD  Subjective: More awake compared to yesterday-but confused  Objective: Vital signs in last 24 hours: Filed Vitals:   02/22/11 1351 02/22/11 2145 02/23/11 0600 02/23/11 1400  BP: 133/72 102/62 136/78 112/68  Pulse: 78 106 98 79  Temp: 99.7 F (37.6 C) 98.1 F (36.7 C) 98.2 F (36.8 C) 100.3 F (37.9 C)  TempSrc:  Oral Oral   Resp: 16 18 20 18   Height:      Weight:      SpO2: 100% 92% 93% 95%    Weight change:   Body mass index is 15.44 kg/(m^2).  Intake/Output from previous day:  Intake/Output Summary (Last 24 hours) at 02/23/11 1842 Last data filed at 02/23/11 0950  Gross per 24 hour  Intake   1200 ml  Output      0 ml  Net   1200 ml    PHYSICAL EXAM: Gen Exam: not in any distress, awake but confused Neck: Supple, No JVD.   Chest: B/L Clear.   CVS: S1 S2 Regular, no murmurs.  Abdomen: soft, BS +, non tender, non distended.  Extremities: no edema, lower extremities warm to touch. Neurologic: Non Focal Skin: No Rash.   Wounds: N/A.    LAB RESULTS: CBC  Lab 02/23/11 0640 02/20/11 0916 02/20/11 0903  WBC 10.8* -- 12.0*  HGB 13.0 13.6 12.7  HCT 38.1 40.0 38.9  PLT 99* -- 118*  MCV 89.9 -- 91.7  MCH 30.7 -- 30.0  MCHC 34.1 -- 32.6  RDW 13.5 -- 14.2  LYMPHSABS -- -- --  MONOABS -- -- --  EOSABS -- -- --  BASOSABS -- -- --  BANDABS -- -- --    Chemistries   Lab 02/23/11 0640 02/20/11 0916 02/20/11 0903  NA 135 139 137  K 3.5 4.2 4.1  CL 103 104 101  CO2 23 -- 27  GLUCOSE 100* 148* 145*  BUN 10 21 21   CREATININE 0.65 0.90 4.78  CALCIUM 7.5* -- 8.6  MG -- -- --    GFR Estimated Creatinine Clearance: 33.7 ml/min (by C-G formula based on Cr of 0.65).  Coagulation profile  Lab 02/20/11 0903  INR 1.05  PROTIME --    Cardiac Enzymes No results found for this basename: CK:3,CKMB:3,TROPONINI:3,MYOGLOBIN:3  in the last 168 hours  No components found with this basename: POCBNP:3 No results found for this basename: DDIMER:2 in the last 72 hours No results found for this basename: HGBA1C:2 in the last 72 hours No results found for this basename: CHOL:2,HDL:2,LDLCALC:2,TRIG:2,CHOLHDL:2,LDLDIRECT:2 in the last 72 hours No results found for this basename: TSH,T4TOTAL,FREET3,T3FREE,THYROIDAB in the last 72 hours No results found for this basename: VITAMINB12:2,FOLATE:2,FERRITIN:2,TIBC:2,IRON:2,RETICCTPCT:2 in the last 72 hours No results found for this basename: LIPASE:2,AMYLASE:2 in the last 72 hours  Urine Studies No results found for this basename: UACOL:2,UAPR:2,USPG:2,UPH:2,UTP:2,UGL:2,UKET:2,UBIL:2,UHGB:2,UNIT:2,UROB:2,ULEU:2,UEPI:2,UWBC:2,URBC:2,UBAC:2,CAST:2,CRYS:2,UCOM:2,BILUA:2 in the last 72 hours  MICROBIOLOGY: Recent Results (from the past 240 hour(s))  URINE CULTURE     Status: Normal   Collection Time   02/20/11 11:48 AM      Component Value Range Status Comment   Specimen Description URINE, CATHETERIZED   Final    Special Requests NONE   Final    Setup Time [id number]   Final    Colony Count NO GROWTH   Final    Culture NO GROWTH   Final    Report Status 02/21/2011 FINAL  Final   MRSA PCR SCREENING     Status: Normal   Collection Time   02/22/11  1:04 PM      Component Value Range Status Comment   MRSA by PCR NEGATIVE  NEGATIVE  Final     RADIOLOGY STUDIES/RESULTS: Ct Head Wo Contrast  02/21/2011  *RADIOLOGY REPORT*  Clinical Data: Follow-up left frontal hemorrhage  CT HEAD WITHOUT CONTRAST  Technique:  Contiguous axial images were obtained from the base of the skull through the vertex without contrast.  Comparison: 06/13/2010  Findings: Motion artifact limits the study.  There is a left inferior frontal hematoma measuring about 2.8 x 4.2 cm.  This appears to represent intraparenchymal hematoma although an extra- axial collection is not entirely excluded.  Smaller rounded  area of acute hemorrhage in the posterior left inferior temporal region measuring 1.2 cm.  Smaller focal intraparenchymal hemorrhage in the right periventricular white matter measuring 1.2 cm diameter. Subarachnoid hemorrhage in the right sulci.  Intraventricular hemorrhage with blood layering in the posterior ventricular horns. These changes are new since the previous comparison study from 06/13/2010.  Since the history indicates that this is a follow-up study, I am presuming that there is another outside study which is not available at the time of dictation.  There is some mass effect in the left frontal region with effacement of the anterior horn of the lateral ventricle.  Minimal midline shift.  Diffuse cerebral atrophy.  Ventricular dilatation consistent with central atrophy.  Low attenuation change in the deep white matter consistent small vessel ischemia.  No effacement of basal cisterns. Vascular calcifications.  No depressed skull fractures.  Visualized paranasal sinuses are not opacified.  IMPRESSION: Left anterior frontal, left posterior temporal, and right white matter hematomas.  Subarachnoid blood on the right. Intraventricular hemorrhage.  No significant mass effect or cerebral edema.  No recent comparison studies available.  Results discussed with the patient's nurse on 3100, [name], at 0446 hours on 02/21/2011.  Original Report Authenticated By: Marlon Pel, M.D.   Ct Abdomen Pelvis W Contrast  02/20/2011  *RADIOLOGY REPORT*  Clinical Data: Trauma.  Fall.  Acute versus chronic hip injury. Low back and groin pain.  CT ABDOMEN AND PELVIS WITH CONTRAST  Technique:  Multidetector CT imaging of the abdomen and pelvis was performed following the standard protocol during bolus administration of intravenous contrast.  Contrast: OMNIPAQUE IOHEXOL 300 MG/ML IV SOLN  Comparison: 11/28/2008.  Findings: Atelectasis and nonspecific interstitial changes are present at the lung bases.  Visualized  portions of the heart appear within normal limits.  The liver and gallbladder unremarkable. Adrenal glands normal.  Normal renal enhancement and delayed excretion of contrast.  Markedly tortuous abdominal aorta with kinking at the level of the renal arteries.  Maximal diameter infrarenal abdominal aorta is 22 mm.  Foley catheter present in the urinary bladder with iatrogenic air.  No free fluid.  Colonic diverticulosis without diverticulitis.  Small bowel appears within normal limits.  Healed bilateral obturator ring fractures are present.  Diffuse osteopenia.  Sclerosis of the left sacral ala compatible with healed insufficiency fracture.  Chronic compression fractures of the thoracolumbar spine appear little changed compared to prior exam.  There is ankylosis of the L2-L3 and L4-L5.  L4 superior endplate compression fracture is unchanged.  No retropulsion.  The L2 superior endplate compression fracture is unchanged with mild retropulsion.  The L1 compression fracture appears similar as well.  Vertebral augmentation at T11 and T12.  Partially visualized T10 superior  endplate compression fracture is likely chronic.  Per CMS PQRS reporting requirements (PQRS Measure 24): Given the patient's age of greater than 50 and the fracture site (hip, distal radius, or spine), the patient should be tested for osteoporosis using DXA, and the appropriate treatment considered based on the DXA results.  There is a right greater trochanteric fracture with minimal extension inferiorly.  The femoral neck appears intact.  Left proximal femur appears intact.  IMPRESSION: 1.  Acute right greater trochanter fracture. Extension of fracture plane into the base of the greater trochanter.  The femoral neck is intact.  In this osteopenic patient, MRI of the right hip may be useful to further assess the extent of intertrochanteric extension. 2.  Healed obturator ring fractures, and left sacral ala fracture. 3.  Chronic multilevel thoracolumbar  compression fractures with lower thoracic vertebral augmentation.  4.  Atherosclerosis with markedly tortuous abdominal aorta.  Original Report Authenticated By: Andreas Newport, M.D.   Dg Chest Portable 1 View  02/20/2011  *RADIOLOGY REPORT*  Clinical Data: Syncope.  Fall.  Weakness.  Hypertension.  PORTABLE CHEST - 1 VIEW  Comparison: 06/13/2010.  Findings: There is no pneumothorax identified.  Cardiopericardial silhouette is within normal limits for projection.  Upper thoracic vertebral augmentation.  No displaced rib fractures.  Emphysema is present with bilateral pleural apical scarring.  Osteopenia.  Lower thoracic vertebral augmentation is new compared to 06/13/2010. 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 Stable calcified granuloma in the left midlung.  IMPRESSION: No acute cardiopulmonary disease.  Probable underlying emphysema and bilateral pleural apical scarring.  Original Report Authenticated By: Andreas Newport, M.D.    MEDICATIONS: Scheduled Meds:    . calcium-vitamin D  1 tablet Oral Daily  . docusate sodium  100 mg Oral BID  . dronabinol  2.5 mg Oral BID AC  . Ensure Plus  1 Can Oral BID  . famotidine  20 mg Oral BID  . fentaNYL  25 mcg Transdermal Q72H  . levothyroxine  25 mcg Oral Daily  . LORazepam  0.5 mg Oral QHS  . megestrol  800 mg Oral Daily  . mirtazapine  15 mg Oral QHS  .  morphine injection  4 mg Intravenous Once  . multivitamins ther. w/minerals  1 tablet Oral Daily  . pantoprazole  40 mg Oral Q1200  . senna-docusate  2 tablet Oral QHS  . Vitamin D (Ergocalciferol)  50,000 Units Oral Q30 days  . white petrolatum       Continuous Infusions:    . DISCONTD: dextrose 5 % and 0.9% NaCl Stopped (02/23/11 1030)   PRN Meds:.HYDROcodone-acetaminophen, HYDROcodone-acetaminophen, HYDROcodone-acetaminophen, loratadine, meclizine, morphine, ondansetron  Antibiotics: Anti-infectives    None     Assessment/Plan:  Fall  -Not sure whether this was a   syncopal episode  -Awaiting 2-D echo-but doubt this is unchanged overall course. She appears to be failing to thrive  Thrombocytopenia: -?etiology -recheck in am  Failure to thrive -Very poor oral intake -Gen. decline in health apparently has been going on for a while. -Can try  Megace, we'll get nutrition to see her. -Also suggest a palliative care evaluation.  Subdural hematoma -Neurosurgery following   Right femoral fracture -Non surgical fracture  Hypothyroidism -Continue with levothyroxine  Osteoporosis -Resume bisphosphonates on discharge. Continue with vitamin D and calcium supplementation. -Will need outpatient yearly DEXA scan.  GERD -Continue with PPI  Disposition: Would likely need SNF at discharge  Rest per primary service  Maretta Bees,  MD. 02/23/2011, 6:42  PM

## 2011-02-23 NOTE — Progress Notes (Addendum)
INITIAL ADULT NUTRITION ASSESSMENT Date: 02/23/2011   Time: 10:52 AM  Reason for Assessment: Consult  ASSESSMENT: Female 77 y.o.  Dx: Subdural hemorrhage following injury  Hx:  Past Medical History  Diagnosis Date  . GERD (gastroesophageal reflux disease)   . Osteoporosis   . Peptic ulcer disease   . Anemia of chronic disease   . Iron deficiency   . Peripheral neuropathy     Symptoms of  . Varicose veins   . Dizziness     chronic    Related Meds:     . calcium-vitamin D  1 tablet Oral Daily  . docusate sodium  100 mg Oral BID  . dronabinol  2.5 mg Oral BID AC  . Ensure Plus  1 Can Oral BID  . famotidine  20 mg Oral BID  . fentaNYL  25 mcg Transdermal Q72H  . levothyroxine  25 mcg Oral Daily  . LORazepam  0.5 mg Oral QHS  . megestrol  800 mg Oral Daily  . mirtazapine  15 mg Oral QHS  .  morphine injection  4 mg Intravenous Once  . multivitamins ther. w/minerals  1 tablet Oral Daily  . pantoprazole  40 mg Oral Q1200  . senna-docusate  2 tablet Oral QHS  . Vitamin D (Ergocalciferol)  50,000 Units Oral Q30 days  . white petrolatum         Ht: 5\' 4"  (162.6 cm)  Wt: 89 lb 15.2 oz (40.8 kg)  Ideal Wt: 54.5 kg % Ideal Wt: 75%  Usual Wt: unknown % Usual Wt: ---  Body mass index is 15.44 kg/(m^2).  Food/Nutrition Related Hx: no nutrition problems identified per nutrition screen  Labs:  CMP     Component Value Date/Time   NA 135 02/23/2011 0640   NA 135 10/14/2009 1410   K 3.5 02/23/2011 0640   K 4.5 10/14/2009 1410   CL 103 02/23/2011 0640   CL 100 10/14/2009 1410   CO2 23 02/23/2011 0640   CO2 28 10/14/2009 1410   GLUCOSE 100* 02/23/2011 0640   GLUCOSE 111 10/14/2009 1410   BUN 10 02/23/2011 0640   BUN 22 10/14/2009 1410   CREATININE 0.65 02/23/2011 0640   CREATININE 0.99 12/04/2010 1346   CALCIUM 7.5* 02/23/2011 0640   CALCIUM 9.0 10/14/2009 1410   PROT 6.6 02/20/2011 0903   PROT 6.4 10/14/2009 1410   ALBUMIN 3.1* 02/20/2011 0903   AST 22 02/20/2011 0903   AST 24 10/14/2009 1410   ALT 15 02/20/2011 0903   ALKPHOS 79 02/20/2011 0903   ALKPHOS 66 10/14/2009 1410   BILITOT 0.4 02/20/2011 0903   BILITOT 0.50 10/14/2009 1410   GFRNONAA 79* 02/23/2011 0640   GFRAA >90 02/23/2011 0640    I/O last 3 completed shifts: In: 1530 [P.O.:10; I.V.:1520] Out: 300 [Urine:300]    Diet Order: Regular  Supplements/Tube Feeding: Ensure Plus PO BID  IVF:    DISCONTD: dextrose 5 % and 0.9% NaCl Last Rate: 40 mL/hr at 02/21/11 1524    Estimated Nutritional Needs:   Kcal: 1,100-1,300 Protein: Fluid: >1.5 L  RD spoke with pt's daughter re: nutrition hx -- states her appetite is rather poor; reports progressive weight loss over a year -- unable to quantify amount; likes Ensure with ice cream mixed in; suspect some level of malnutrition given weight loss, inadequate oral intake; meets criteria for underweight state  NUTRITION DIAGNOSIS: -Inadequate oral intake (NI-2.1).  Status: Ongoing  RELATED TO: poor appetite, advanced age  AS EVIDENCE BY: PO  intake 0-30%  MONITORING/EVALUATION(Goals): Goal: meet >90% of estimated nutrition needs to prevent further weigh loss Monitor: PO intake, weight, labs, I/O's  EDUCATION NEEDS: -No education needs identified at this time  INTERVENTION:  Continue Ensure Clinical Strength PO TID  RD to follow for nutrition care plan  Dietitian #: [phone]  DOCUMENTATION CODES Per approved criteria  -Underweight    Alger Memos 02/23/2011, 10:52 AM

## 2011-02-23 NOTE — Progress Notes (Signed)
Patient ID: Tiffany Austin, female   DOB: Dec 29, 1924, 77 y.o.   MRN: [id number]   LOS: 3 days   Subjective: Remains basically non verbal today, but nods head yes/no to answer questions. She does follow simple motor commands. She has had only sips for po intake, and remains on IVF to maintain hydration.  Objective: Vital signs in last 24 hours: Temp:  [98.1 F (36.7 C)-99.7 F (37.6 C)] 98.2 F (36.8 C) (12/11 0600) Pulse Rate:  [78-106] 98  (12/11 0600) Resp:  [16-20] 20  (12/11 0600) BP: (102-136)/(62-78) 136/78 mmHg (12/11 0600) SpO2:  [92 %-100 %] 93 % (12/11 0600) Last BM Date: 02/20/11  Head CT from this am- reading pending, but worsening edema/shift and mass effect noted on our review this am.   *RADIOLOGY REPORT*  Clinical Data: Follow-up left frontal hemorrhage  CT HEAD WITHOUT CONTRAST  Technique: Contiguous axial images were obtained from the base of  the skull through the vertex without contrast.  Comparison: 06/13/2010  Findings: Motion artifact limits the study. There is a left  inferior frontal hematoma measuring about 2.8 x 4.2 cm. This  appears to represent intraparenchymal hematoma although an extra-  axial collection is not entirely excluded. Smaller rounded area of  acute hemorrhage in the posterior left inferior temporal region  measuring 1.2 cm. Smaller focal intraparenchymal hemorrhage in the  right periventricular white matter measuring 1.2 cm diameter.  Subarachnoid hemorrhage in the right sulci. Intraventricular  hemorrhage with blood layering in the posterior ventricular horns.  These changes are new since the previous comparison study from  06/13/2010. Since the history indicates that this is a follow-up  study, I am presuming that there is another outside study which is  not available at the time of dictation.  There is some mass effect in the left frontal region with  effacement of the anterior horn of the lateral ventricle. Minimal  midline  shift.  Diffuse cerebral atrophy. Ventricular dilatation consistent with  central atrophy. Low attenuation change in the deep white matter  consistent small vessel ischemia. No effacement of basal cisterns.  Vascular calcifications. No depressed skull fractures. Visualized  paranasal sinuses are not opacified.  IMPRESSION:  Left anterior frontal, left posterior temporal, and right white  matter hematomas. Subarachnoid blood on the right.  Intraventricular hemorrhage. No significant mass effect or  cerebral edema. No recent comparison studies available.  Results discussed with the patient's nurse on 3100, [name], at 0446  hours on 02/21/2011.  Original Report Authenticated By: Marlon Pel, M.D.  General appearance: alert, cachectic and no distress, non verbal, but F/C as noted above Resp: clear to auscultation bilaterally Cardio: regular rate and rhythm GI: normal findings: soft, non-tender and abnormal findings:  hypoactive bowel sounds   Assessment/Plan: Fall TBI w/Left ICC frontotemporal area-- Her follow up Head CT scan seems to show increasing edema and slight mass effect and shift now. Will check with neurosurgery, but per daughter yesterday, it does not appear that the patient or family would want aggressive treatment.  Right hip fx of greater trochanter only -- WBAT Multiple medical problems -- per IM. Stable. FEN -- Maintaining hydration with IVF,labs okay, but little to no po intake as noted.  VTE -- SCD's Dispo -- Family not present this am. Palliative Care likely appropriate at this time, but will discuss with family further today.    Franki Monte Pager [phone] General Trauma Pager [phone]   02/23/2011 ADDEN: 9:21 am- Pt up with PT and sitting in  chair. Seems somewhat more alert and spoke several words appropriately. Daughter is here now and feeding pt Ensure mixed with ice cream, and pt does seems to like this and is eating some. We discussed the patient's  need to eat and she is encouraging the pt to eat. I discussed the possibility of needing a PEG tube with the daughter and she stated that her father had a PEG for some time before he died. She thinks her mother might be agreeable to this if she cannot maintain hydration and eat more. Will DC IVF for now and monitor intake. Plan SNF later this week if able to eat and take po fluids well enough.

## 2011-02-24 LAB — BASIC METABOLIC PANEL
CO2: 22 mEq/L (ref 19–32)
Chloride: 105 mEq/L (ref 96–112)
Creatinine, Ser: 0.68 mg/dL (ref 0.50–1.10)

## 2011-02-24 LAB — CBC
HCT: 33.8 % — ABNORMAL LOW (ref 36.0–46.0)
MCV: 89.4 fL (ref 78.0–100.0)
RBC: 3.78 MIL/uL — ABNORMAL LOW (ref 3.87–5.11)
RDW: 13.4 % (ref 11.5–15.5)
WBC: 9.4 10*3/uL (ref 4.0–10.5)

## 2011-02-24 MED ORDER — POTASSIUM CHLORIDE CRYS ER 20 MEQ PO TBCR
20.0000 meq | EXTENDED_RELEASE_TABLET | Freq: Three times a day (TID) | ORAL | Status: AC
  Administered 2011-02-24 (×3): 20 meq via ORAL
  Filled 2011-02-24 (×4): qty 1

## 2011-02-24 MED ORDER — ENSURE CLINICAL ST REVIGOR PO LIQD
237.0000 mL | Freq: Three times a day (TID) | ORAL | Status: DC
  Administered 2011-02-24 (×2): 237 mL via ORAL

## 2011-02-24 NOTE — Progress Notes (Signed)
Clinical Child psychotherapist confirmed bed availability at [facility] in [place].  CSW notified patient daughter of bed availability.  CSW will continue to follow for emotional support and discharge planning needs once patient medically ready for discharge.    [address] [place], [place] [phone]

## 2011-02-24 NOTE — Progress Notes (Signed)
Patient ID: Tiffany Austin, female   DOB: 02-Oct-1924, 77 y.o.   MRN: [id number] Very slow mobility with therapy. Overall, no acute changes from ortho standpoint.  Exam: Right hip painful over greater trochanter and with hip motion  Rec: Per therapy, likely SNF.

## 2011-02-24 NOTE — Progress Notes (Signed)
[name] Age: 77 y.o. Sex: female Date of Birth: 1924/07/31 Admit Date: 02/20/2011 [id number] Dayton Martes, MD, MD  Subjective: Sitting on the bed, NT is feeding her dinner, awake but confused.  Objective: Vital signs in last 24 hours: Filed Vitals:   02/23/11 1400 02/23/11 2150 02/24/11 0530 02/24/11 1400  BP: 112/68 100/66 111/67 101/63  Pulse: 79 73 92 88  Temp: 100.3 F (37.9 C) 98.3 F (36.8 C) 98.2 F (36.8 C) 97.8 F (36.6 C)  TempSrc:      Resp: 18 16 16 18   Height:      Weight:      SpO2: 95% 99% 96% 94%    Weight change:   Body mass index is 15.44 kg/(m^2).  Intake/Output from previous day:  Intake/Output Summary (Last 24 hours) at 02/24/11 1720 Last data filed at 02/24/11 0700  Gross per 24 hour  Intake    100 ml  Output      0 ml  Net    100 ml    PHYSICAL EXAM: Gen Exam: not in any distress, awake but confused Neck: Supple, No JVD.   Chest: B/L Clear.   CVS: S1 S2 Regular, no murmurs.  Abdomen: soft, BS +, non tender, non distended.  Extremities: no edema, lower extremities warm to touch. Neurologic: Non Focal Skin: No Rash.   Wounds: N/A.    LAB RESULTS: CBC  Lab 02/24/11 0600 02/23/11 0640 02/20/11 0916 02/20/11 0903  WBC 9.4 10.8* -- 12.0*  HGB 11.4* 13.0 13.6 12.7  HCT 33.8* 38.1 40.0 38.9  PLT 103* 99* -- 118*  MCV 89.4 89.9 -- 91.7  MCH 30.2 30.7 -- 30.0  MCHC 33.7 34.1 -- 32.6  RDW 13.4 13.5 -- 14.2  LYMPHSABS -- -- -- --  MONOABS -- -- -- --  EOSABS -- -- -- --  BASOSABS -- -- -- --  BANDABS -- -- -- --    Chemistries   Lab 02/24/11 0600 02/23/11 0640 02/20/11 0916 02/20/11 0903  NA 137 135 139 137  K 3.1* 3.5 4.2 4.1  CL 105 103 104 101  CO2 22 23 -- 27  GLUCOSE 86 100* 148* 145*  BUN 11 10 21 21   CREATININE 0.68 0.65 0.90 0.77  CALCIUM 7.9* 7.5* -- 8.6  MG -- -- -- --    MICROBIOLOGY: Recent Results (from the past 240 hour(s))  URINE CULTURE     Status: Normal   Collection Time   02/20/11 11:48 AM      Component Value Range Status Comment   Specimen Description URINE, CATHETERIZED   Final    Special Requests NONE   Final    Setup Time [id number]   Final    Colony Count NO GROWTH   Final    Culture NO GROWTH   Final    Report Status 02/21/2011 FINAL   Final   MRSA PCR SCREENING     Status: Normal   Collection Time   02/22/11  1:04 PM      Component Value Range Status Comment   MRSA by PCR NEGATIVE  NEGATIVE  Final     MEDICATIONS: Scheduled Meds:    . calcium-vitamin D  1 tablet Oral Daily  . docusate sodium  100 mg Oral BID  . dronabinol  2.5 mg Oral BID AC  . famotidine  20 mg Oral BID  . feeding supplement  237 mL Oral TID WC  . fentaNYL  25 mcg Transdermal Q72H  . levothyroxine  25 mcg Oral Daily  . LORazepam  0.5 mg Oral QHS  . megestrol  800 mg Oral Daily  . mirtazapine  15 mg Oral QHS  .  morphine injection  4 mg Intravenous Once  . multivitamins ther. w/minerals  1 tablet Oral Daily  . pantoprazole  40 mg Oral Q1200  . potassium chloride  20 mEq Oral TID  . senna-docusate  2 tablet Oral QHS  . Vitamin D (Ergocalciferol)  50,000 Units Oral Q30 days  . white petrolatum      . DISCONTD: Ensure Plus  1 Can Oral BID   Continuous Infusions:   PRN Meds:.HYDROcodone-acetaminophen, HYDROcodone-acetaminophen, HYDROcodone-acetaminophen, loratadine, meclizine, morphine, ondansetron  Antibiotics: Anti-infectives    None     Assessment/Plan:  Fall  -Not sure whether this was a  syncopal episode  -2 D Echo nothing suggests syncope. I recommend no further evaluation in the hospital.  Thrombocytopenia: -?etiology -Counts are better, Recheck in the AM   Failure to thrive -Very poor oral intake -Gen. decline in health apparently has been going on for a while. -Can try  Megace, we'll get nutrition to see her. -If no improvement,Suggest Palliative care to see her  Subdural hematoma -Neurosurgery following, no plans for surgical  intervention   Right femoral fracture -Non surgical fracture  Hypothyroidism -Continue with levothyroxine  Osteoporosis -Resume bisphosphonates on discharge. Continue with vitamin D and calcium supplementation. -Will need outpatient yearly DEXA scan.  GERD -Continue with PPI  Disposition: Would likely need SNF at discharge  Rest per primary service  [facility] A 02/24/2011, 5:20 PM

## 2011-02-24 NOTE — Progress Notes (Signed)
This patient has been seen and I agree with the findings and treatment plan.  Newman Waren O. Amenah Tucci, III, MD, FACS [phone] (pager) [phone] (direct pager) Trauma Surgeon  

## 2011-02-24 NOTE — Progress Notes (Signed)
Patient ID: Tiffany Austin, female   DOB: [birth year], 77 y.o.   MRN: [id number]   LOS: 4 days   Subjective: Remains basically non verbal today, but nods head yes/no to answer questions. She does follow simple motor commands. She has had only sips for po intake, and remains on IVF to maintain hydration.  Objective: Vital signs in last 24 hours: Temp:  [98.2 F (36.8 C)-100.3 F (37.9 C)] 98.2 F (36.8 C) (12/12 0530) Pulse Rate:  [73-92] 92  (12/12 0530) Resp:  [16-18] 16  (12/12 0530) BP: (100-112)/(66-68) 111/67 mmHg (12/12 0530) SpO2:  [95 %-99 %] 96 % (12/12 0530) Last BM Date: 02/24/11    General appearance: alert, cachectic and no distress, slightly more verbal today Resp: clear to auscultation bilaterally Cardio: regular rate and rhythm GI: normal findings: soft, non-tender and abnormal findings:  hypoactive bowel sounds   Assessment/Plan: Fall TBI w/Left ICC frontotemporal area-- continue supportive care, therapies Right hip fx of greater trochanter only -- WBAT Multiple medical problems -- per IM. Stable. FEN -- Monitoring po intake and helping with all meals, maintaining hydration status well thus far, and intake seems slightly improved HypoKalemia- Supplement po VTE -- SCD's Dispo --Plan SNF    [facility] Pager [phone] General Trauma Pager [phone]

## 2011-02-25 LAB — BASIC METABOLIC PANEL
BUN: 15 mg/dL (ref 6–23)
CO2: 20 mEq/L (ref 19–32)
Chloride: 104 mEq/L (ref 96–112)
Creatinine, Ser: 0.66 mg/dL (ref 0.50–1.10)
Glucose, Bld: 90 mg/dL (ref 70–99)

## 2011-02-25 LAB — CBC
HCT: 37.4 % (ref 36.0–46.0)
MCH: 30.6 pg (ref 26.0–34.0)
MCHC: 34 g/dL (ref 30.0–36.0)
MCV: 90.1 fL (ref 78.0–100.0)
RDW: 13.4 % (ref 11.5–15.5)

## 2011-02-25 MED ORDER — MEGESTROL ACETATE 400 MG/10ML PO SUSP: 800.0000 mg | Freq: Every day | ORAL | Status: AC

## 2011-02-25 MED ORDER — HYDROCODONE-ACETAMINOPHEN 5-325 MG PO TABS: 0.5000 | ORAL_TABLET | ORAL | Status: AC | PRN

## 2011-02-25 NOTE — Plan of Care (Signed)
Problem: Phase III Progression Outcomes Goal: Activity at appropriate level-compared to baseline (UP IN CHAIR FOR HEMODIALYSIS)  Outcome: Progressing Pt more alert in chair and participating with appropriate response to greetings. Pt oriented to self only.

## 2011-02-25 NOTE — Progress Notes (Signed)
Patient ID: Tiffany Austin, female   DOB: February 22, 1925, 77 y.o.   MRN: [id number]   LOS: 5 days   Subjective: Seems more somnolent today.   Objective: Vital signs in last 24 hours: Temp:  [97.5 F (36.4 C)-99.5 F (37.5 C)] 97.5 F (36.4 C) (12/13 0551) Pulse Rate:  [85-94] 94  (12/13 0551) Resp:  [16-18] 18  (12/13 0551) BP: (101-119)/(63-71) 108/71 mmHg (12/13 0551) SpO2:  [94 %-98 %] 98 % (12/13 0551) Last BM Date: 02/24/11 (incontinent)    General appearance: alert, cachectic and somnolent Resp: clear to auscultation bilaterally Cardio: regular rate and rhythm, slightly tachycardic GI: normal findings: soft, non-tender and abnormal findings:  hypoactive bowel sounds Neuro: Less alert and opened eyes with latency to stimuli, did not follow commands for me today.   Assessment/Plan: Fall TBI w/Left ICC frontotemporal area-- continue supportive care, therapies Right hip fx of greater trochanter only -- WBAT Multiple medical problems -- per IM. Stable. FEN -- Very little intake last 24 hours and more somnolent off IVF HypoKalemia- Supplement po VTE -- SCD's Dispo - Planning SNF, but pt seems worse today. Feel palliative care may be helpful to family to establish goals of care for patient and to come to terms with her traumatic injuries. Daughter had been somewhat resistant to this previously, but last evening seemed to be somewhat more realistic about the patient's status and severity of illness. Will ask Palliative Care to see.   RAYBURN,SHAWN,PA-C Pager [phone] General Trauma Pager [phone]  ADDEN; Later in the am, the patient is much more alert and conversant now. She is also drinking Ensure and juice. I am still not confident that the patient will be able to maintain intake over the [place] haul to sustain herself. This has obviously been a [place] standing problem, however, and so will allow to DC to SNF today.

## 2011-02-25 NOTE — Progress Notes (Signed)
Occupational Therapy Treatment [name] MRN: [id number] DOB: 08/24/1924 Today's Date: 02/25/2011  OT Assessment/Plan OT Assessment/Plan Comments on Treatment Session: Pt c/o pain directly at Rt hip joint and able to verbalize and point with UE. Pt no c/o pain while in chair. OT Plan: Discharge plan remains appropriate OT Frequency: Min 2X/week Follow Up Recommendations: Skilled nursing facility Equipment Recommended: Defer to next venue OT Goals Acute Rehab OT Goals OT Goal Formulation: Patient unable to participate in goal setting Time For Goal Achievement: 2 weeks ADL Goals Pt Will Perform Eating: with set-up;Sitting, chair;with cueing (comment type and amount) ADL Goal: Eating - Progress: Progressing toward goals Pt Will Perform Grooming: with supervision;with cueing (comment type and amount);Unsupported;Sitting, edge of bed ADL Goal: Grooming - Progress: Progressing toward goals Pt Will Transfer to Toilet: with min assist;3-in-1;Stand pivot transfer ADL Goal: Toilet Transfer - Progress: Not addressed Miscellaneous OT Goals Miscellaneous OT Goal #1: Pt will maintain sustained attention for 15 mins with no more than min instructional cues during selfcare tasks. OT Goal: Miscellaneous Goal #1 - Progress: Progressing toward goals  OT Treatment Precautions/Restrictions  Precautions Precautions: Fall Restrictions Weight Bearing Restrictions: No RLE Weight Bearing: Weight bearing as tolerated   ADL ADL Grooming: Performed;Brushing hair;Maximal assistance Grooming Details (indicate cue type and reason): mod v/c for inicitation and hand over hand to continue tasks. pt with tactile cue to brush opposite side of head with verbal cue. pt terminating task prematurely.  Where Assessed - Grooming: Sitting, chair;Supported Mobility  Bed Mobility Bed Mobility: Yes Supine to Sit: 2: Max assist;HOB flat;With rails Transfers Transfers: Yes Sit to Stand: With  upper extremity assist;1: +2 Total assist;Patient percentage (comment);From bed (75) Sit to Stand Details (indicate cue type and reason): Pt NWB on RLE during transfer reaching for chair with UE and hip flexion with transfer. Stand to Sit: 2: Max assist;Without upper extremity assist;To chair/3-in-1 Exercises    End of Session OT - End of Session Equipment Utilized During Treatment: Gait belt Activity Tolerance: Patient tolerated treatment well Patient left: with call bell in reach;in chair (educated on call button and pt return demo which button forA) Nurse Communication: Mobility status for transfers General Behavior During Session: Flat affect Cognition: Impaired Cognitive Impairment: Pt oriented to self. Pt educated on location and reason for admission. pt recalled education ~1 minute after educated. Pt responding to most questions with DOB answer during session. Pt appropriate response to "good Morning" with "good morning or hello" Pt visually scanning Lt sitting in chair. Pt much improved arousal compared to previous OT session documentation.  Harrel Carina [facility]  02/25/2011, 11:08 AM Pager: [phone]

## 2011-02-25 NOTE — Progress Notes (Signed)
Clinical Social Worker facilitated patient discharge needs including contacting patient family and facility and arranging ambulance transport to [facility] and [address].  RN to call report to facility prior to patient discharge.  Patient with no further social work needs at this time.  [address] [place], [place] [phone]

## 2011-02-25 NOTE — Discharge Summary (Signed)
Physician Discharge Summary  Patient ID: Tiffany Austin MRN: [id number] DOB/AGE: 1924/12/12 77 y.o.  Admit date: 02/20/2011 Discharge date: 02/25/2011  Admission Diagnoses: Fall with large left fronto-temporal hematoma and avulsion fracture of the right greater trochanter  Discharge Diagnoses:  Principal Problem:  *Left intracerebral hematoma following injury Active Problems:  Fall  Right greater trochanteric fracture without displacement   Discharged Condition: fair  Hospital Course: Tiffany Austin is an 77 year old Caucasian female with a past medical history significant for multiple compression fractures shortly due to osteoporosis, who fell at her assisted living facility apparently striking her head and right hip. She was taken to [facility] and workup of there including a CT scan of the head revealed a left intracerebral hematoma. She also had a avulsion fracture of the right greater trochanter. Secondary to these traumatic injuries she was transferred to [facility] trauma service for further evaluation and treatment.  She was seen in consultation by Dr. Gerlene Fee of neurosurgery and a followup head CT scan was obtained and showed worsening of her intracerebral contusion with increase in size and some early left to right shift. Clinically the patient's mental status waxes and wanes. She does have some difficulty speaking, but is even more responsive today than she has been. She is verbalizing intermittently albeit in very brief often single word phrases at most. She is following commands. The possibility of neurosurgical intervention was broached with the family on several occasions and the family did not desire surgical intervention for the patient's hematoma at this time given her advanced age and multiple medical issues. Again, clinically, she does seem to be improving. She is to remain off of all anticoagulation secondary to this large intracerebral hematoma.  Medically she  was worked up by the internal medicine service to rule out syncopal episode as the etiology of the patient's fall. EKG did not show any evidence of arrhythmia. 2-D echocardiogram did not show any evidence for thrombi or other abnormality. No further workup was felt to be necessary. Her chronic medical issues were followed by internal medicine throughout this admission as well and medications were adjusted as appropriate.  As far as her right greater trochanter avulsion fracture, she was seen in consultation by Dr. Allie Bossier, and it was felt that this fracture could be treated conservatively with physical therapy and weight-bearing as tolerated with a walker. She's not been terribly mobile however since this incident. She will need continued therapies to progress with her mobility status at the skilled nursing facility.  The patient has had significant nutritional issues in the past and is quite cachectic on her admission here. He the family reports that that has been a Helt-term issue and they have continued to encourage the patient to eat as well as take supplements to improve her nutrition status and also gain weight. Currently we have been able to maintain the patient's hydration with direct assistance with all meals and encouragement to drink supplements. She will need very close monitoring with regards to this as she is at high risk for readmission secondary to dehydration.  Consults: orthopedic surgery- Dr. Allie Bossier, Neurosurgery- Dr. Gerlene Fee, Internal Medicine-[facility] Significant Diagnostic Studies: radiology  : CT scan: CT HEAD WITHOUT CONTRAST  Technique: Contiguous axial images were obtained from the base of the skull through the vertex without contrast.  Comparison: 02/21/2011.  Findings: There is worsening left frontal intracerebral hemorrhage when compared with 02/21/2011. Cross-sectional measurements today are 42 x 40 mm with slight increase surrounding edema.  Measured at the level of the septum pellucidum, there is 4 mm of left to right shift. Both of these are increased from priors. There is a roughly stable left posterior temporal hemorrhage measuring 9 x 14 mm. Subtle left peritentorial subdural hematoma appears slightly more prominent along its mastoid attachment. Layering intraventricular hemorrhage is redemonstrated and stable. Atrophy with chronic microvascular ischemic change again noted. No calvarial fracture. No sinus or mastoid fluid.  IMPRESSION: The dominant abnormality is worsening of the left frontal intracerebral hematoma, now measuring 40 x 42 mm. Increase surrounding edema. Early 4 mm left to right shift. Slight worsening left posterior temporal subdural and parenchymal bleed. Trauma Service aware.     Treatments: IV hydration, analgesia: Vicodin and therapies: PT, OT and SW  Discharge Exam: Blood pressure 108/71, pulse 94, temperature 97.5 F (36.4 C), temperature source Oral, resp. rate 18, height 5\' 4"  (1.626 m), weight 40.8 kg (89 lb 15.2 oz), SpO2 98.00%. General appearance: appears stated age, cachectic, mild distress and slowed mentation Resp: clear to auscultation bilaterally Cardio: regular rate and rhythm GI: soft, non-tender; bowel sounds normal; no masses,  no organomegaly Extremities: tender about right lateral hip  Disposition: Skilled Nursing Facility   Current Discharge Medication List    START taking these medications   Details  HYDROcodone-acetaminophen (NORCO) 5-325 MG per tablet Take 0.5 tablets by mouth every 4 (four) hours as needed. Qty: 30 tablet, Refills: 0    megestrol (MEGACE) 400 MG/10ML suspension Take 20 mLs (800 mg total) by mouth daily. Qty: 240 mL      CONTINUE these medications which have NOT CHANGED   Details  CALCIUM ALGINATE EX Apply 1 application topically daily as needed. With optifoam to left ankle periwound as needed     Control Gel Formula Dressing (DUODERM CGF  BORDER DRESSING EX) Apply 1 application topically 2 (two) times daily. For ulcer healing     dronabinol (MARINOL) 2.5 MG capsule Take 2.5 mg by mouth 2 (two) times daily before a meal.      ergocalciferol (VITAMIN D2) 50000 UNITS capsule Take 50,000 Units by mouth every 30 (thirty) days.      levothyroxine (SYNTHROID, LEVOTHROID) 25 MCG tablet Take 25 mcg by mouth daily.      loratadine (CLARITIN) 10 MG tablet Take 10 mg by mouth daily as needed. For allergies    meclizine (ANTIVERT) 12.5 MG tablet Take 1 tablet (12.5 mg total) by mouth 3 (three) times daily as needed for dizziness or nausea. Qty: 90 tablet, Refills: 0    mirtazapine (REMERON) 15 MG tablet Take 15 mg by mouth at bedtime.      ondansetron (ZOFRAN) 4 MG tablet Take 4 mg by mouth every 8 (eight) hours as needed. nausea      STOP taking these medications     fentaNYL (DURAGESIC - DOSED MCG/HR) 25 MCG/HR      hydrocodone-acetaminophen (LORCET-HD) 5-500 MG per capsule      oxycodone (OXY-IR) 5 MG capsule      ranitidine (ZANTAC) 150 MG tablet      aspirin 81 MG EC tablet      CALCIUM-VITAMIN D PO      Cholecalciferol (VITAMIN D-400 PO)      DAILY MULTIPLE VITAMINS PO      denosumab (PROLIA) 60 MG/ML SOLN      docusate sodium (COLACE) 100 MG capsule      Ensure Plus (ENSURE PLUS) LIQD      Ibandronate Sodium (BONIVA) 2.5 MG TABS  LORazepam (ATIVAN) 0.5 MG tablet      Nutritional Supplements (RESOURCE ARGINAID) PACK      omeprazole (PRILOSEC) 20 MG capsule      promethazine (PHENERGAN) 25 MG tablet      Sennosides-Docusate Sodium (SENNA PLUS PO)        Follow-up Information    Follow up with Ruthe Mannan, MD .         Signed: Lazaro Arms 02/25/2011, 12:00 PM

## 2011-02-25 NOTE — Progress Notes (Signed)
Should be able to go to SNF today.  She can get dehydrated easily, so they must make sure that she takes adequate PO liquids.  This patient has been seen and I agree with the findings and treatment plan.  Marta Lamas. Gae Bon, MD, FACS [phone] (pager) [phone] (direct pager) Trauma Surgeon

## 2011-02-25 NOTE — Progress Notes (Signed)
Patient ID: Tiffany Austin, female   DOB: 1924/05/14, 77 y.o.   MRN: [id number] Subjective: Patient reports No complaints  Objective: Vital signs in last 24 hours: Temp:  [97.5 F (36.4 C)-99.5 F (37.5 C)] 97.5 F (36.4 C) (12/13 0551) Pulse Rate:  [85-94] 94  (12/13 0551) Resp:  [16-18] 18  (12/13 0551) BP: (101-119)/(63-71) 108/71 mmHg (12/13 0551) SpO2:  [94 %-98 %] 98 % (12/13 0551)  Intake/Output from previous day:   Intake/Output this shift:    More awake, alert and conversant today. Follows some complex commands.   Lab Results:  Basename 02/25/11 0610 02/24/11 0600  WBC 10.4 9.4  HGB 12.7 11.4*  HCT 37.4 33.8*  PLT 122* 103*   BMET  Basename 02/25/11 0610 02/24/11 0600  NA 133* 137  K 4.5 3.1*  CL 104 105  CO2 20 22  GLUCOSE 90 86  BUN 15 11  CREATININE 0.66 0.68  CALCIUM 8.3* 7.9*    Studies/Results: No results found.  Assessment/Plan: Stable. To somewhat better.  LOS: 5 days  as above   Reinaldo Meeker, MD 02/25/2011, 10:56 AM

## 2011-02-25 NOTE — Progress Notes (Signed)
   CARE MANAGEMENT NOTE 02/25/2011  Patient:  Tiffany Austin, Tiffany Austin   Account Number:  [id number]  Date Initiated:  02/25/2011  Documentation initiated by:  Carlyle Lipa  Subjective/Objective Assessment:   fall at ALF where pt lives; TBI     Action/Plan:   To SNF level of care at discharge   Anticipated DC Date:  02/25/2011   Anticipated DC Plan:  SKILLED NURSING FACILITY  In-house referral  Clinical Social Worker      DC Planning Services  CM consult             Status of service:  Completed, signed off  Discharge Disposition:  SKILLED NURSING FACILITY  Per UR Regulation:  Reviewed for med. necessity/level of care/duration of stay

## 2011-02-25 NOTE — Discharge Summary (Signed)
This patient has been seen and I agree with the findings and treatment plan.  Gila Lauf O. Deddrick Saindon, III, MD, FACS [phone] (pager) [phone] (direct pager) Trauma Surgeon  

## 2011-03-02 DIAGNOSIS — R634 Abnormal weight loss: Secondary | ICD-10-CM

## 2011-03-02 DIAGNOSIS — F329 Major depressive disorder, single episode, unspecified: Secondary | ICD-10-CM

## 2011-03-02 DIAGNOSIS — I629 Nontraumatic intracranial hemorrhage, unspecified: Secondary | ICD-10-CM

## 2011-03-12 DIAGNOSIS — R627 Adult failure to thrive: Secondary | ICD-10-CM

## 2011-03-19 DIAGNOSIS — R627 Adult failure to thrive: Secondary | ICD-10-CM

## 2011-03-19 DIAGNOSIS — S72109A Unspecified trochanteric fracture of unspecified femur, initial encounter for closed fracture: Secondary | ICD-10-CM

## 2011-03-19 DIAGNOSIS — F329 Major depressive disorder, single episode, unspecified: Secondary | ICD-10-CM

## 2011-03-30 DIAGNOSIS — R634 Abnormal weight loss: Secondary | ICD-10-CM

## 2011-03-30 DIAGNOSIS — R64 Cachexia: Secondary | ICD-10-CM

## 2011-03-30 DIAGNOSIS — S065X9A Traumatic subdural hemorrhage with loss of consciousness of unspecified duration, initial encounter: Secondary | ICD-10-CM

## 2011-04-21 DIAGNOSIS — F329 Major depressive disorder, single episode, unspecified: Secondary | ICD-10-CM

## 2011-04-21 DIAGNOSIS — R627 Adult failure to thrive: Secondary | ICD-10-CM

## 2011-04-21 DIAGNOSIS — S72109A Unspecified trochanteric fracture of unspecified femur, initial encounter for closed fracture: Secondary | ICD-10-CM

## 2011-05-01 IMAGING — CT CT ANGIO CHEST
2 of 7 series · 18 of 46 positions shown · IV contrast (APPLIED)
Comparison: Pelvis CT on 11/24/2008

CTA CHEST

CLINICAL DATA: Pelvic fracture.  Acute drop in hematocrit.  Short
of breath and dizziness. Evaluate for retroperitoneal hemorrhage
and pulmonary embolism.

CT ANGIOGRAPHY CHEST
CT ABDOMEN AND PELVIS WITH CONTRAST
TECHNIQUE: Multidetector CT imaging of the chest was performed
using the standard protocol during bolus administration of
intravenous contrast. Multiplanar CT image reconstructions
including MIPs were obtained to evaluate the vascular anatomy.
Multidetector CT imaging of the abdomen and pelvis was performed
intravenous contrast.
Contrast: 100 ml Zmnipaque-IQQ

[Series 8: pe/abd/pelvis 1.0 b25f thins · axial · 0.68mm/px · z∈[-506,-22]mm · 15 of 534 slices shown]
[im 25/534  lung]
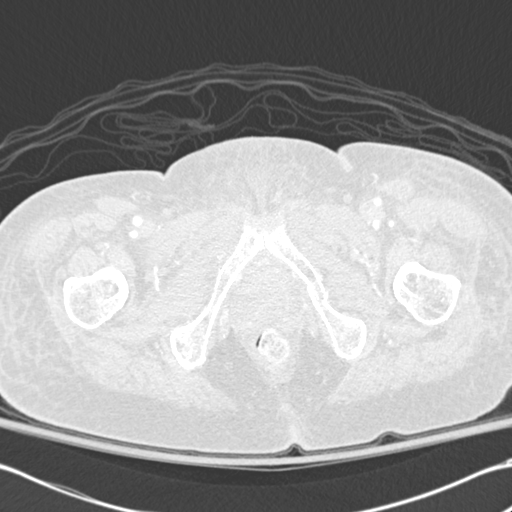
[im 73/534  soft-tissue]
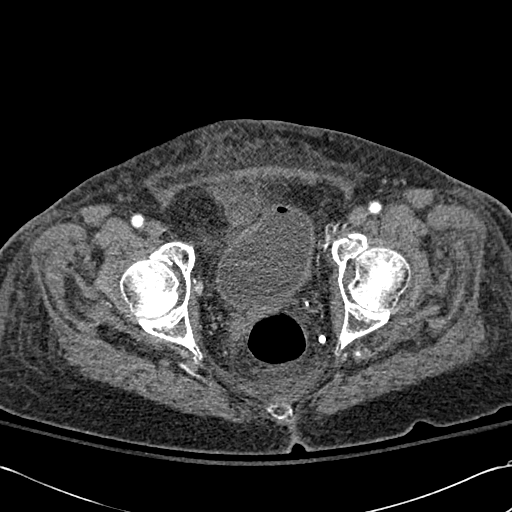
[im 97/534  lung]
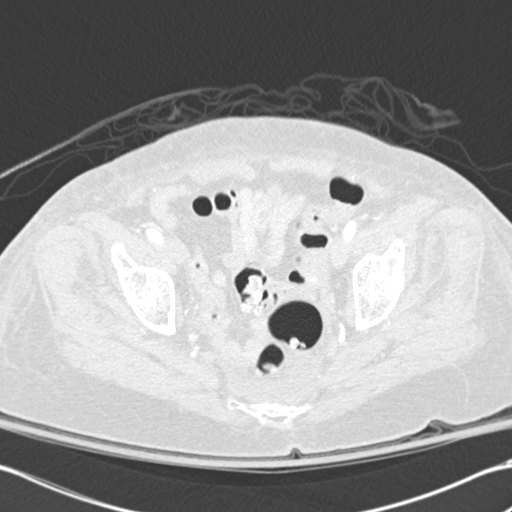
[im 122/534  soft-tissue]
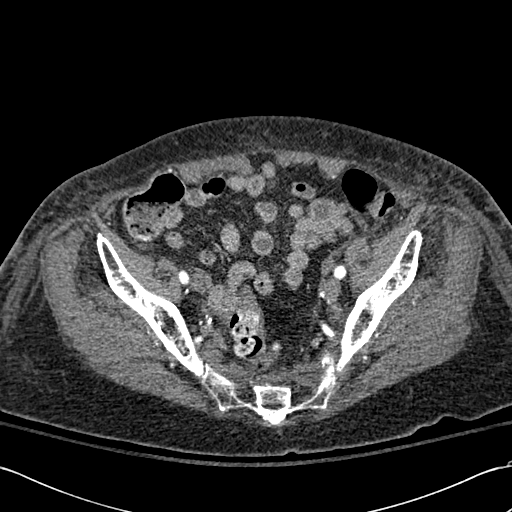
[im 170/534  lung]
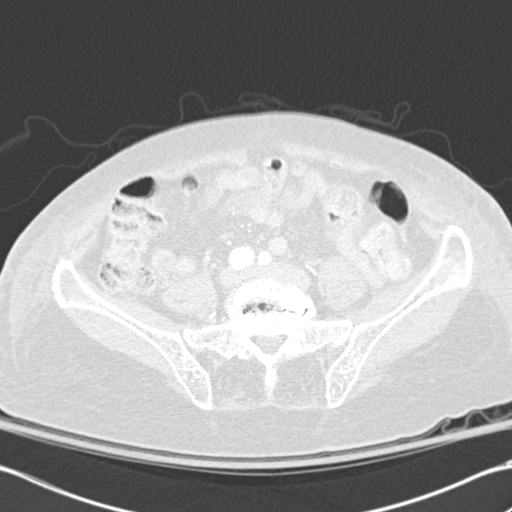
[im 194/534  soft-tissue]
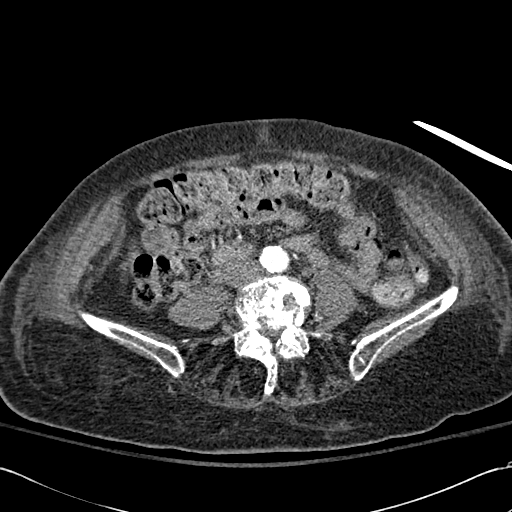
[im 243/534  lung]
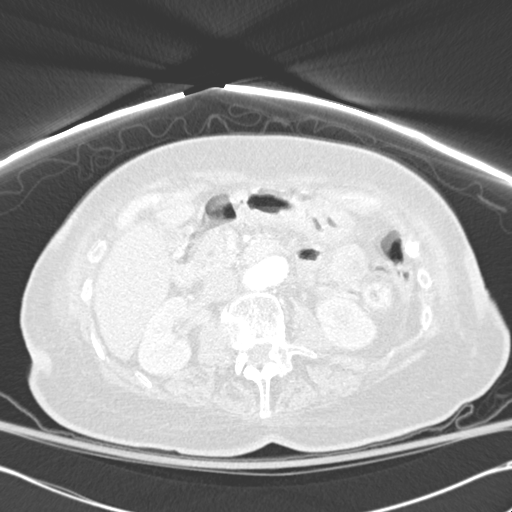
[im 267/534  soft-tissue]
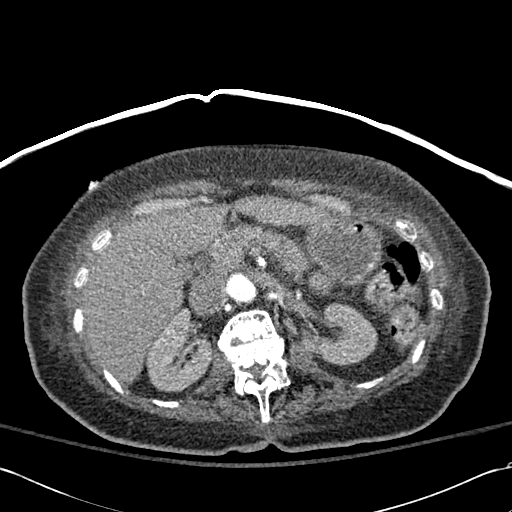
[im 291/534  lung]
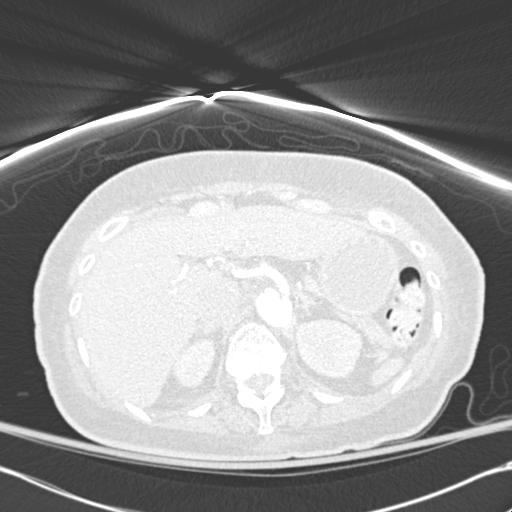
[im 340/534  soft-tissue]
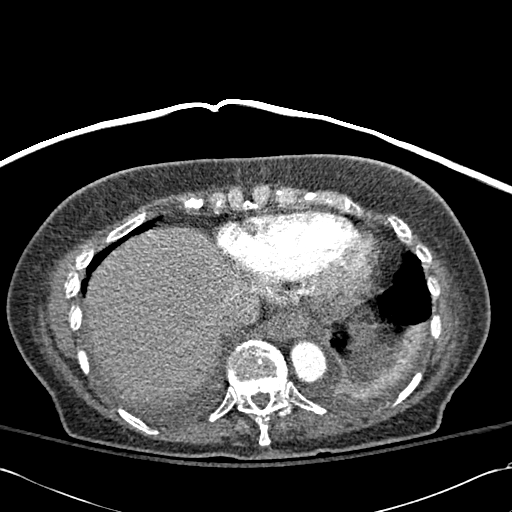
[im 364/534  lung]
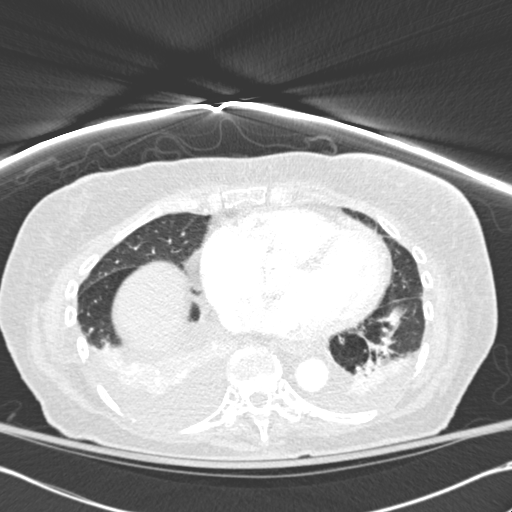
[im 412/534  soft-tissue]
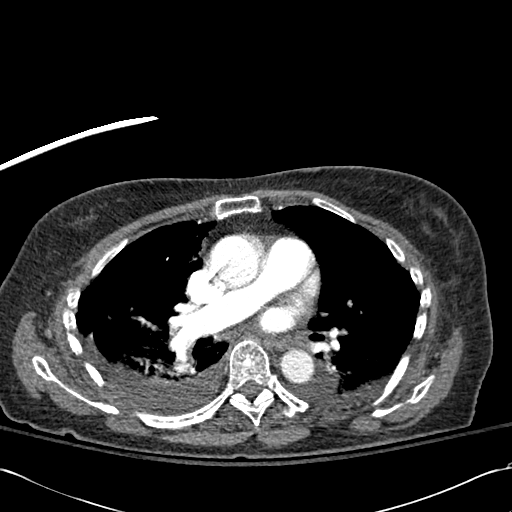
[im 437/534  lung]
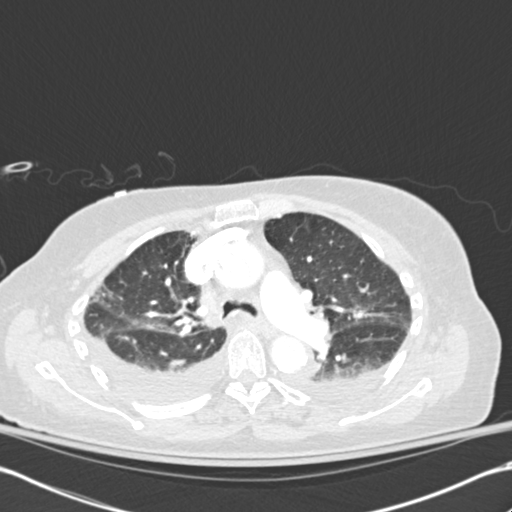
[im 461/534  soft-tissue]
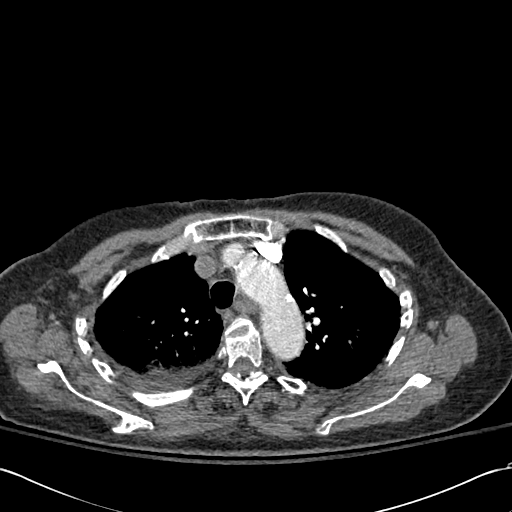
[im 509/534  lung]
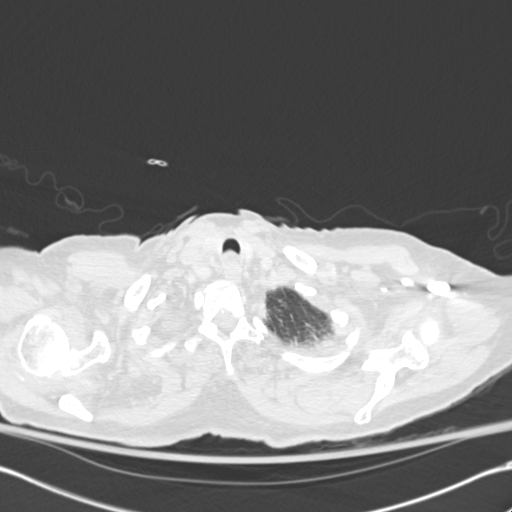

[Series 10: pe/abd/pelvis 2.0 cor · coronal · 1.04mm/px · 3 of 89 slices shown]
[im 23/89  soft-tissue]
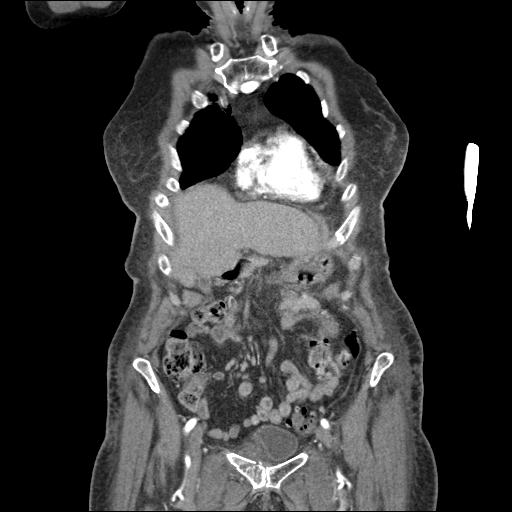
[im 45/89  soft-tissue]
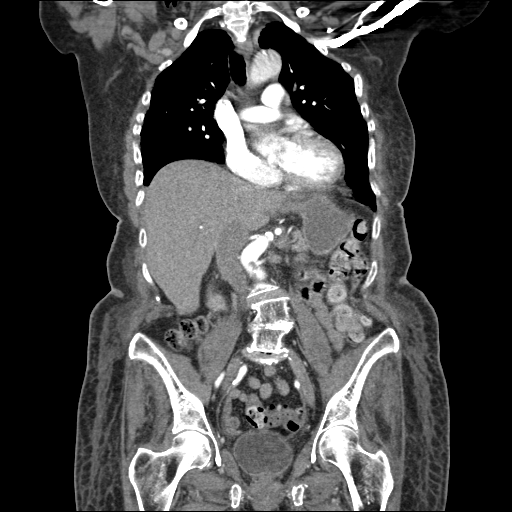
[im 67/89  soft-tissue]
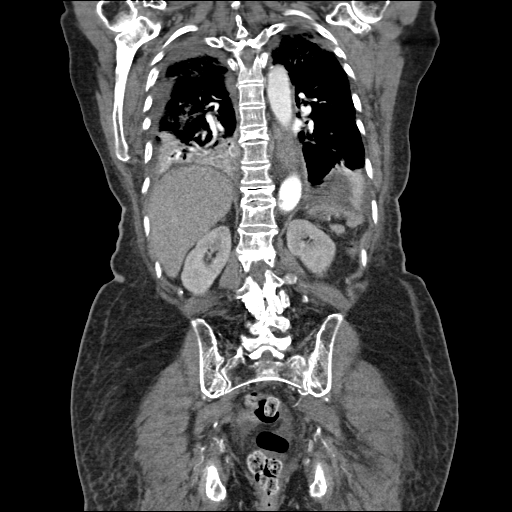

[18 of 46 positions shown; findings below may reference images not displayed]

FINDINGS: Satisfactory opacification of the pulmonary arteries is
seen, and no acute pulmonary embolism identified.  There is no
evidence of thoracic aortic aneurysm or dissection.  There is no
evidence of hilar or mediastinal masses.

A small pleural effusions are seen bilaterally with dependent
atelectasis.  Patchy ground-glass opacity is also seen bilaterally
suspicious for mild pulmonary edema.

 Review of the MIP images confirms the above findings.
IMPRESSION: 1.  No evidence of acute pulmonary embolism.
2.  Probable mild pulmonary edema.  Small bilateral pleural
effusions and dependent atelectasis.

CT ABDOMEN
FINDINGS: There is no evidence of retroperitoneal hemorrhage.  The
abdominal parenchymal organs are normal in appearance.  There is no
evidence of mass or lymphadenopathy.  There is no evidence of
inflammatory process or abnormal fluid collections.
IMPRESSION: No evidence of retroperitoneal hemorrhage or other acute findings.

CT PELVIS
FINDINGS: Mild intrapelvic hematoma is seen in the space of Retzius
in the right suprapubic region, with mild mass effect on the
urinary bladder.  Mild presacral hemorrhage is also noted.
Fractures are again seen involving the bilateral pubic rami,
anterior wall left acetabulum and left sacral ala.

There is no evidence of intraperitoneal hemorrhage.  There is no
evidence of pelvic soft tissue mass or inflammatory process.
Diverticulosis of the sigmoid colon is noted.
IMPRESSION: Mild extraperitoneal hematoma in the anterior space of Retzius and
presacral region.  Multiple pelvic fractures again noted.

## 2011-05-21 DIAGNOSIS — Z9181 History of falling: Secondary | ICD-10-CM

## 2011-05-21 DIAGNOSIS — R627 Adult failure to thrive: Secondary | ICD-10-CM

## 2011-05-21 DIAGNOSIS — F329 Major depressive disorder, single episode, unspecified: Secondary | ICD-10-CM

## 2011-06-07 ENCOUNTER — Other Ambulatory Visit: Payer: Medicare Other

## 2011-06-16 DIAGNOSIS — F329 Major depressive disorder, single episode, unspecified: Secondary | ICD-10-CM

## 2011-06-16 DIAGNOSIS — Z9181 History of falling: Secondary | ICD-10-CM

## 2011-06-16 DIAGNOSIS — E039 Hypothyroidism, unspecified: Secondary | ICD-10-CM

## 2011-06-28 ENCOUNTER — Telehealth: Payer: Self-pay | Admitting: *Deleted

## 2011-06-28 DIAGNOSIS — S065X9A Traumatic subdural hemorrhage with loss of consciousness of unspecified duration, initial encounter: Secondary | ICD-10-CM

## 2011-06-28 DIAGNOSIS — R64 Cachexia: Secondary | ICD-10-CM

## 2011-06-28 DIAGNOSIS — R634 Abnormal weight loss: Secondary | ICD-10-CM

## 2011-06-28 NOTE — Telephone Encounter (Signed)
Fax from the [facility] is on your desk, they need clarification on roxanol, along with hard scripts for roxonal and norco.

## 2011-06-29 MED ORDER — MORPHINE SULFATE (CONCENTRATE) 20 MG/ML PO SOLN: ORAL | Status: AC

## 2011-06-29 MED ORDER — HYDROCODONE-ACETAMINOPHEN 5-325 MG PO TABS: ORAL_TABLET | ORAL | Status: AC

## 2011-06-29 NOTE — Telephone Encounter (Signed)
Norco dose is 5/325, one half every 8 hrs.

## 2011-06-29 NOTE — Telephone Encounter (Signed)
Rx for roxanol in my box. Please verify what dose of Norco she is receiving so I can write rx.

## 2011-06-29 NOTE — Telephone Encounter (Signed)
Rx written.

## 2011-06-29 NOTE — Telephone Encounter (Signed)
Scripts placed up front for pick up by [facility], meds put on med list in chart.

## 2011-06-30 ENCOUNTER — Telehealth: Payer: Self-pay | Admitting: *Deleted

## 2011-06-30 NOTE — Telephone Encounter (Signed)
[facility] have faxed another form regarding roxanol and norco. This is on your desk for signature.

## 2011-07-01 NOTE — Telephone Encounter (Signed)
In my box

## 2011-07-01 NOTE — Telephone Encounter (Signed)
Order faxed back.

## 2011-07-06 ENCOUNTER — Other Ambulatory Visit: Payer: Medicare Other

## 2011-07-13 ENCOUNTER — Telehealth: Payer: Self-pay | Admitting: *Deleted

## 2011-07-13 NOTE — Telephone Encounter (Signed)
Noted  

## 2011-07-13 NOTE — Telephone Encounter (Signed)
Home health nurse called to get a verbal ok for pt to have home health evaluation for nursing, physical and occupational therapy.  Verbal ok given.

## 2011-07-26 DIAGNOSIS — R209 Unspecified disturbances of skin sensation: Secondary | ICD-10-CM

## 2011-07-26 DIAGNOSIS — S069X9S Unspecified intracranial injury with loss of consciousness of unspecified duration, sequela: Secondary | ICD-10-CM

## 2011-07-26 DIAGNOSIS — M6281 Muscle weakness (generalized): Secondary | ICD-10-CM

## 2011-07-26 DIAGNOSIS — R269 Unspecified abnormalities of gait and mobility: Secondary | ICD-10-CM

## 2011-09-24 ENCOUNTER — Encounter: Payer: Self-pay | Admitting: Family Medicine

## 2011-09-24 ENCOUNTER — Ambulatory Visit (INDEPENDENT_AMBULATORY_CARE_PROVIDER_SITE_OTHER): Payer: Medicare Other | Admitting: Family Medicine

## 2011-09-24 VITALS — BP 108/70 | HR 72 | Temp 97.7°F | Wt 94.0 lb

## 2011-09-24 DIAGNOSIS — M549 Dorsalgia, unspecified: Secondary | ICD-10-CM

## 2011-09-24 DIAGNOSIS — R627 Adult failure to thrive: Secondary | ICD-10-CM

## 2011-09-24 DIAGNOSIS — F329 Major depressive disorder, single episode, unspecified: Secondary | ICD-10-CM

## 2011-09-24 NOTE — Progress Notes (Signed)
78 yo female here with her daughter, Lowella Bandy, for follow up.  She has h/o severe osteoporosis and who has multiple compression fractures  and chronic anemia.  Most recent vertebral surgery s/p lumbar compression fx in 06/18/2010. Was originally at [facility], had to be moved to the [place] for [employer] purposes.  She is hapy there.     Failure to thrive- Was on marinol, remeron, could not gain weight- was as low as 80 pounds and followed by hospice. We stopped all medications (only takes prn pain and anti emetics prn) and she is now up to 94 pounds!  She is about this.  Mood is good.  Her daughter does want her to restart PT.  Having more bladder incontinence, often bowel incontinence as well.  No dysuria.   Wt Readings from Last 3 Encounters:  09/24/11 94 lb (42.638 kg)  02/20/11 89 lb 15.2 oz (40.8 kg)  12/04/10 91 lb (41.277 kg)      ROS:  See HPI See does have some numbness in her lower extremities  Patient Active Problem List  Diagnosis  . HELICOBACTER PYLORI GASTRITIS  . HYPOTHYROIDISM  . HYPERKALEMIA  . ANEMIA OF CHRONIC DISEASE  . DEPRESSION  . PERIPHERAL NEUROPATHY  . LBBB  . GERD  . PEPTIC ULCER DISEASE  . UTI  . DEGENERATIVE DISC DISEASE, LUMBAR SPINE  . BACK PAIN, THORACIC REGION  . BACK PAIN, CHRONIC  . OSTEOPOROSIS  . DIZZINESS, CHRONIC  . Other Malaise and Fatigue  . INCONTINENCE  . VERTEBRAL FRACTURE, THORACIC SPINE  . FRACTURE, PELVIS  . CLOSED FRACTURE OF UNSPECIFIED BONE OF FOOT  . SKIN CANCER, HX OF  . Decubitus ulcer  . Vertigo  . Failure to thrive in adult  . Fall  . Subdural hemorrhage following injury  . Right femur fx   Past Medical History  Diagnosis Date  . GERD (gastroesophageal reflux disease)   . Osteoporosis   . Peptic ulcer disease   . Anemia of chronic disease   . Iron deficiency   . Peripheral neuropathy     Symptoms of  . Varicose veins   . Dizziness     chronic   Past Surgical History  Procedure Date  .  Tonsillectomy   . Abdominal hysterectomy     Fibroids, ovaries intact, age 31  . Bladder repair     With hyst  . Appendectomy   . Cataract extraction     Implant  . Dexa 06/2000    Slightly improved osteopenia  . Spine surgery 07/2000    Compound fracture sping - T9, L1  . Fracture surgery     L2, L3 compoud fracture in the past  . Cardiolite 08/2001    Neg EF 65%  . Dexa 01/2003    OP at femeral neck  . Colonoscopy 02/2004    Divertics, hems  . Dexa 01/2005    Osteopenia at femoral neck  . Foot surgery 2007  . Fetal blood transfusion 10/2005  . Esophagogastroduodenoscopy 11/2005    Gastic ulcer  . Esophagogastroduodenoscopy 02/2006    Schatzki ring, HH, mucosal abnormalities, Bx H pylori  . Esophagogastroduodenoscopy 01/2008    Schatzki ring, HH    Family History  Problem Relation Age of Onset  . Stroke Father   . Heart attack Brother     MI  . Heart attack Brother     MI  . Heart attack Brother     MI  . Colon cancer Other  Grandfather   Allergies  Allergen Reactions  . Alprazolam     REACTION: headaches  . Calcitonin ([place])     REACTION: dizzy  . Ibandronate Sodium     REACTION: dysphagia  . Raloxifene     REACTION: cramps   Current Outpatient Prescriptions on File Prior to Visit  Medication Sig Dispense Refill  . HYDROcodone-acetaminophen (NORCO) 5-325 MG per tablet One half tablet by mouth every 8 hours as needed for pain  90 tablet  0  . loratadine (CLARITIN) 10 MG tablet Take 10 mg by mouth daily as needed. For allergies      . meclizine (ANTIVERT) 12.5 MG tablet Take 1 tablet (12.5 mg total) by mouth 3 (three) times daily as needed for dizziness or nausea.  90 tablet  0  . morphine (ROXANOL) 20 MG/ML concentrated solution 0.5 to 1 ml every one hour as needed for pain and shortness of breath  30 mL  0  . ondansetron (ZOFRAN) 4 MG tablet Take 4 mg by mouth every 8 (eight) hours as needed. nausea      . promethazine (PHENERGAN) 25 MG suppository  Place 25 mg rectally every 4 (four) hours as needed.      . ergocalciferol (VITAMIN D2) 50000 UNITS capsule Take 50,000 Units by mouth every 30 (thirty) days.        Marland Kitchen levothyroxine (SYNTHROID, LEVOTHROID) 25 MCG tablet Take 25 mcg by mouth daily.        . megestrol (MEGACE) 400 MG/10ML suspension Take 20 mLs (800 mg total) by mouth daily.  240 mL     Current Facility-Administered Medications on File Prior to Visit  Medication Dose Route Frequency Provider Last Rate Last Dose  . denosumab (PROLIA) injection 60 mg  60 mg Subcutaneous Q6 months Dianne Dun, MD   60 mg at 12/04/10 1342    The PMH, PSH, Social History, Family History, Medications, and allergies have been reviewed in [facility], and have been updated if relevant.   PHYSICAL EXAMINATION:  BP 108/70  Pulse 72  Temp 97.7 F (36.5 C)  Wt 94 lb (42.638 kg)  GENERAL: She is alert and oriented, in no apparent distress,  cooperating fairly, chronically ill appearing and fragile.  HEENT: Extraocular movements intact. Pupils equal to light and  reactive to light. Oropharynx clear. Mucous membranes moist.  NECK: No JVD. No carotid bruits.  CARDIAC: Regular rate and rhythm without murmurs, rubs, or gallops.  CHEST: Clear to auscultation bilaterally. No wheeze, rhonchi, or  rales.  ABDOMEN: Soft, nontender, nondistended. Positive bowel sounds. No  hepatosplenomegaly.  EXTREMITIES: No clubbing, cyanosis, or edema.  PSYCH: Normal affect. No focal neurological deficits.    1. Bladder incontinence  >Unable to leave urine sample today. Given order for UA and urine cx to take back to [facility](asked to send me results). May also have some spinal stenosis but family agrees she does not want this worked up.   2. DEGENERATIVE DISC DISEASE, LUMBAR SPINE  Stable denosumab (PROLIA) injection 60 mg  3. Anxiety-  Stable off meds.      4. Failure to thrive in adult   Improved off all medications. No longer on hospice.

## 2011-10-05 DIAGNOSIS — M519 Unspecified thoracic, thoracolumbar and lumbosacral intervertebral disc disorder: Secondary | ICD-10-CM

## 2011-10-05 DIAGNOSIS — R262 Difficulty in walking, not elsewhere classified: Secondary | ICD-10-CM

## 2011-10-05 DIAGNOSIS — M6250 Muscle wasting and atrophy, not elsewhere classified, unspecified site: Secondary | ICD-10-CM

## 2011-10-05 DIAGNOSIS — R32 Unspecified urinary incontinence: Secondary | ICD-10-CM

## 2011-10-20 DIAGNOSIS — R32 Unspecified urinary incontinence: Secondary | ICD-10-CM

## 2011-10-20 DIAGNOSIS — M6250 Muscle wasting and atrophy, not elsewhere classified, unspecified site: Secondary | ICD-10-CM

## 2011-10-20 DIAGNOSIS — R262 Difficulty in walking, not elsewhere classified: Secondary | ICD-10-CM

## 2011-10-20 DIAGNOSIS — M519 Unspecified thoracic, thoracolumbar and lumbosacral intervertebral disc disorder: Secondary | ICD-10-CM

## 2011-11-08 DIAGNOSIS — IMO0001 Reserved for inherently not codable concepts without codable children: Secondary | ICD-10-CM

## 2011-11-08 DIAGNOSIS — R269 Unspecified abnormalities of gait and mobility: Secondary | ICD-10-CM

## 2011-11-08 DIAGNOSIS — G609 Hereditary and idiopathic neuropathy, unspecified: Secondary | ICD-10-CM

## 2011-11-08 DIAGNOSIS — M6281 Muscle weakness (generalized): Secondary | ICD-10-CM

## 2011-11-19 ENCOUNTER — Telehealth: Payer: Self-pay

## 2011-11-19 NOTE — Telephone Encounter (Signed)
Left message asking daughter to call back on Monday.

## 2011-11-19 NOTE — Telephone Encounter (Signed)
Pts daughter, Nicolette said pt is refusing PT. Physical therapist thinks pt may be depressed; pts daughter wants to know if can try antidepressant. Pt is at [facility].Please advise.

## 2011-11-19 NOTE — Telephone Encounter (Signed)
We have tried antidepressants in past (at [facility]) and either Tiffany Austin would refer them or made her feel worse (buspar). I would recommend bringing her in to see me to discuss (30 minute appt please).

## 2011-11-22 ENCOUNTER — Telehealth: Payer: Self-pay | Admitting: *Deleted

## 2011-11-22 NOTE — Telephone Encounter (Signed)
Nicolette Shanklin left v/m to call her back at [phone].

## 2011-11-22 NOTE — Telephone Encounter (Signed)
Noted! Thank you

## 2011-11-22 NOTE — Telephone Encounter (Signed)
Spoke with patient.  She says she doesn't think pt will take antidepressants.  She's going to leave things as they are for right now.  She will call back if needed.

## 2011-11-25 NOTE — Telephone Encounter (Signed)
Opened in error

## 2012-06-14 ENCOUNTER — Telehealth: Payer: Self-pay | Admitting: *Deleted

## 2012-06-14 NOTE — Telephone Encounter (Signed)
The [place] of  dropped off orders to be signed.  Call [phone] when ready for pickup.

## 2012-06-14 NOTE — Telephone Encounter (Signed)
Orders are on your desk 

## 2012-06-15 NOTE — Telephone Encounter (Signed)
Orders signed and in my box.

## 2012-06-15 NOTE — Telephone Encounter (Signed)
Forms are ready for pick up, advised facility.

## 2012-06-20 ENCOUNTER — Telehealth: Payer: Self-pay | Admitting: Family Medicine

## 2012-06-20 NOTE — Telephone Encounter (Signed)
Physician standing order forms were dropped off for the patient.  I have placed them on your desk. Thank you. ° °

## 2012-06-20 NOTE — Telephone Encounter (Signed)
This mssg should have went to you [facility]i [facility] instead of [place], sorry. Physician standing order forms were dropped off for the patient.  I have placed them on your desk. Thank you.

## 2012-06-20 NOTE — Telephone Encounter (Signed)
Forms placed on Dr. Elmer Sow desk for review.

## 2012-06-21 NOTE — Telephone Encounter (Signed)
Form signed and in my box. 

## 2012-06-21 NOTE — Telephone Encounter (Signed)
Forms ready for pick up, advised facility.  Copy sent for scanning.

## 2012-07-13 ENCOUNTER — Ambulatory Visit: Payer: Self-pay | Admitting: Internal Medicine

## 2012-07-21 ENCOUNTER — Inpatient Hospital Stay: Payer: Self-pay | Admitting: Specialist

## 2012-07-21 LAB — COMPREHENSIVE METABOLIC PANEL
Albumin: 2.9 g/dL — ABNORMAL LOW (ref 3.4–5.0)
Anion Gap: 6 — ABNORMAL LOW (ref 7–16)
BUN: 21 mg/dL — ABNORMAL HIGH (ref 7–18)
Calcium, Total: 8.8 mg/dL (ref 8.5–10.1)
Chloride: 106 mmol/L (ref 98–107)
EGFR (African American): 60 — ABNORMAL LOW
EGFR (Non-African Amer.): 52 — ABNORMAL LOW
Osmolality: 277 (ref 275–301)
SGPT (ALT): 15 U/L (ref 12–78)
Sodium: 137 mmol/L (ref 136–145)

## 2012-07-21 LAB — CK TOTAL AND CKMB (NOT AT ARMC)
CK, Total: 56 U/L (ref 21–215)
CK-MB: 1 ng/mL (ref 0.5–3.6)

## 2012-07-21 LAB — CBC
HCT: 40.6 % (ref 35.0–47.0)
Platelet: 138 10*3/uL — ABNORMAL LOW (ref 150–440)

## 2012-07-21 LAB — APTT: Activated PTT: 32.6 secs (ref 23.6–35.9)

## 2012-07-22 LAB — BASIC METABOLIC PANEL
Chloride: 110 mmol/L — ABNORMAL HIGH (ref 98–107)
EGFR (African American): 58 — ABNORMAL LOW
EGFR (Non-African Amer.): 50 — ABNORMAL LOW
Glucose: 103 mg/dL — ABNORMAL HIGH (ref 65–99)
Osmolality: 284 (ref 275–301)

## 2012-07-22 LAB — URINALYSIS, COMPLETE
Nitrite: NEGATIVE
RBC,UR: 95 /HPF (ref 0–5)
Squamous Epithelial: 1
WBC UR: 7 /HPF (ref 0–5)

## 2012-07-22 LAB — CBC WITH DIFFERENTIAL/PLATELET
Basophil #: 0 10*3/uL (ref 0.0–0.1)
Basophil %: 0.4 %
Eosinophil #: 0 10*3/uL (ref 0.0–0.7)
Eosinophil %: 0.1 %
HCT: 37 % (ref 35.0–47.0)
HGB: 12.7 g/dL (ref 12.0–16.0)
Lymphocyte #: 0.8 10*3/uL — ABNORMAL LOW (ref 1.0–3.6)
Lymphocyte %: 8 %
MCV: 89 fL (ref 80–100)
Monocyte #: 1.1 x10 3/mm — ABNORMAL HIGH (ref 0.2–0.9)
Platelet: 128 10*3/uL — ABNORMAL LOW (ref 150–440)
RDW: 14.4 % (ref 11.5–14.5)
WBC: 10.5 10*3/uL (ref 3.6–11.0)

## 2012-07-22 LAB — TSH: Thyroid Stimulating Horm: 9.98 u[IU]/mL — ABNORMAL HIGH

## 2012-07-23 LAB — BASIC METABOLIC PANEL
BUN: 13 mg/dL (ref 7–18)
Calcium, Total: 8 mg/dL — ABNORMAL LOW (ref 8.5–10.1)
Chloride: 106 mmol/L (ref 98–107)
Co2: 20 mmol/L — ABNORMAL LOW (ref 21–32)
Osmolality: 271 (ref 275–301)
Potassium: 4.4 mmol/L (ref 3.5–5.1)
Sodium: 135 mmol/L — ABNORMAL LOW (ref 136–145)

## 2012-07-23 LAB — CBC WITH DIFFERENTIAL/PLATELET
Basophil #: 0.1 10*3/uL (ref 0.0–0.1)
HGB: 11.3 g/dL — ABNORMAL LOW (ref 12.0–16.0)
Lymphocyte #: 1.2 10*3/uL (ref 1.0–3.6)
MCHC: 33.5 g/dL (ref 32.0–36.0)
Monocyte #: 1.1 x10 3/mm — ABNORMAL HIGH (ref 0.2–0.9)
Monocyte %: 9.5 %
Neutrophil %: 79.7 %
RBC: 3.78 10*6/uL — ABNORMAL LOW (ref 3.80–5.20)

## 2012-07-24 LAB — CBC WITH DIFFERENTIAL/PLATELET
Basophil %: 0.3 %
Lymphocyte #: 1 10*3/uL (ref 1.0–3.6)
Lymphocyte %: 8 %
MCH: 29.9 pg (ref 26.0–34.0)
Monocyte #: 1.3 x10 3/mm — ABNORMAL HIGH (ref 0.2–0.9)
Monocyte %: 11.2 %
RDW: 14.7 % — ABNORMAL HIGH (ref 11.5–14.5)

## 2012-07-24 LAB — BASIC METABOLIC PANEL
Anion Gap: 5 — ABNORMAL LOW (ref 7–16)
Chloride: 104 mmol/L (ref 98–107)
Co2: 26 mmol/L (ref 21–32)
EGFR (African American): >60
EGFR (Non-African Amer.): >60
Osmolality: 270 (ref 275–301)
Potassium: 4.7 mmol/L (ref 3.5–5.1)
Sodium: 135 mmol/L — ABNORMAL LOW (ref 136–145)

## 2012-08-13 ENCOUNTER — Ambulatory Visit: Payer: Self-pay | Admitting: Internal Medicine

## 2012-08-13 DEATH — deceased

## 2014-07-05 NOTE — Consult Note (Signed)
   Comments   Met with pt's daughter, son, son-in-law, and grandson. Family updated. They recognize that pt's prognosis is poor and want to focus on her comfort. Family agrees with comfort care (ie. stopping abx, IVFs, etc). We talked about transfer to the [facility] which family is also interested in this. Will ask the hospice liaison to see. Orders entered. If there is no bed availability today would recommend transfer to regular room for comfort care which I also discussed with family.  discussed with attending.  20 minutes  Electronic Signatures: Natha Guin, Kirt Boys (NP)  (Signed 12-May-14 14:22)  Authored: Palliative Care   Last Updated: 12-May-14 14:22 by Irean Hong (NP)

## 2014-07-05 NOTE — H&P (Signed)
PATIENT NAME:  Tiffany Austin, Tiffany Austin MR#:  [id number] DATE OF BIRTH:  05/04/1924  DATE OF ADMISSION:  07/21/2012     ER PHYSICIAN:  Dr. Daryel NovemberJonathan Williams  CHIEF COMPLAINT:  Sent from [facility] because of slurred speech.   HISTORY OF PRESENT ILLNESS: This is an 81 year old demented female, brought in because of slurred speech. She was found to have complete heart block, with heart rate 20, and blood pressure around 54/45. The patient received a couple of doses of atropine. Dr. Juel BurrowMasoud is doing a temporary pacemaker in ICU, and plan for permanent pacemaker tomorrow. The patient'Austin history obtained from the chart. The patient is very poor historian, unable to tell any complaints. Denies any chest pain or trouble breathing. No dizziness. The patient continued to be in complete heart block, with heart rate around 20s, so she is getting the procedure with temporary pacemaker at this time.   PAST MEDICAL HISTORY: According to the nursing home records, she had vertebral fractures, history of back pain, anemia, coronary artery disease, pelvic fracture, degenerative disk disease and dizziness.   ALLERGIES:  No known allergies.   SOCIAL HISTORY:  Unknown.   PAST SURGICAL HISTORY:  Looks like she had surgery for the foot. Looks like she had hysterectomy before.  Other surgical history is not clear.  MEDICATIONS:  she is only on acetaminophen at [facility].   FAMILY HISTORY:  Unable to obtain. No family members here.   MEDICATIONS:  As mentioned.   PHYSICAL EXAMINATION:  VITAL SIGNS:  Temperature is 97.6, heart rate is 28, blood pressure 54/45, sats around 90% on room air.  GENERAL:  The patient is awake and alert, very hard of hearing, but denies any complaints.  HEENT: Head atraumatic, normocephalic. Pupils equally reacting to light. Extraocular movements are intact.  Ears, nose, throat:  No tympanic membrane congestion. No turbinate hypertrophy. No worsening erythema.  NECK:  Normal range of motion. No JVD. No  carotid bruit.  CARDIOVASCULAR:  S1, S2. Severely bradycardic.  LUNGS:  Clear to auscultation.  ABDOMEN:  Soft, nontender, nondistended. Bowel sounds present.  EXTREMITIES:  No extremity edema. No cyanosis. No clubbing.  NEUROLOGIC:  The patient is very slow to react, hard of hearing. No focal neurological deficit. Cranial nerves II through XII intact. No obvious gross neurological deficit.   LAB DATA:  Troponin less than 0.02, CK 56, CPK-MB 1. INR is 1. The patient'Austin WBC 6.7, hemoglobin 13.6, hematocrit 40.6, platelets 138. Electrolytes: Sodium 137, potassium 4.7, chloride 106, bicarb 25, BUN 21, creatinine 0.9 and glucose 105.  EKG:  Complete heart block with rate 28 beats per minute.   ASSESSMENT AND PLAN: 1.  This is an 81 year old female with third-degree AV block. Admit her to ICU.  The patient has no prior history of heart problems. The patient is going to get a temporary pacemaker, and will need permanent pacemaker tomorrow. She has escape rhythm with narrow QRS complexes. The patient is a poor historian will monior in ICU.  2.  Dehydration. Continue IV fluids.  Time spent:  About 50 minutes. This is a critical history and physical. Dr. Juel BurrowMasoud is doing the temporary pacemaker. I appreciate Dr. Juel BurrowMasoud seeing the patient for Cardiology consult, and will follow the patient.    ____________________________ Katha HammingSnehalatha Abelina Ketron, MD sk:mr D: 07/21/2012 20:16:53 ET T: 07/21/2012 21:35:12 ET JOB#: [id number]  cc: Katha HammingSnehalatha Dhani Imel, MD, <Dictator> Katha HammingSNEHALATHA Eban Weick MD ELECTRONICALLY SIGNED 07/28/2012 18:54

## 2014-07-05 NOTE — H&P (Signed)
PATIENT NAME:  Tiffany Austin, Tiffany Austin MR#:  [id number] DATE OF BIRTH:  April 27, 1924  HISTORY OF PRESENT ILLNESS:   Actual history is obtained from the patient'Austin daughter-in-law. According to her, the patient is a nursing home resident for the past 2 years, has a history of dementia. The granddaughter went to see her and she wanted to take her out for dinner but she noticed that she was really having slurred speech and she did not "seem quite right" and the patient also told that she is not feeling well.  That is what happened, and the nursing home thought that she may have a stroke and then that is why they sent her here.  PAST MEDICAL HISTORY: Includes osteoporosis.   PAST SURGICAL HISTORY: No surgeries.    ALLERGIES: No known allergies.   SOCIAL HISTORY: No smoking, no drinking. The patient is wheelchair-bound and does not eat well.  MEDICATIONS: She is not on any medications at the nursing home.     ____________________________ Katha HammingSnehalatha Christian Treadway, MD sk:cp D: 07/21/2012 20:28:39 ET T: 07/21/2012 21:18:29 ET JOB#: [id number]  cc: Katha HammingSnehalatha Kiarra Kidd, MD, <Dictator> Katha HammingSNEHALATHA Alivya Wegman MD ELECTRONICALLY SIGNED 07/28/2012 18:58

## 2014-07-05 NOTE — Op Note (Signed)
PATIENT NAME:  Tiffany Austin, Tiffany Austin MR#:  [id number] DATE OF BIRTH:  02/03/1925  DATE OF PROCEDURE:  07/21/2012  ATTENDING CARDIOLOGIST:  Dr. Shaukat Khan.   IMPLANTING PHYSICIAN:  Dr. Javed Masoud.   PREOPERATIVE DIAGNOSIS:  Complete heart block with frequent syncope.   POSTOPERATIVE DIAGNOSIS:  Complete heart block with frequent syncope.  Insertion of a VVIR single lead, single chamber pacemaker.   DESCRIPTION OF PROCEDURE:  The patient was taken to the operating room and left upper chest was prepared with Hibiclens.  Next, the patient was prepped in a sterile fashion, 1% lidocaine was infiltrated under the skin and subcutaneous tissue.  An incision was made on the skin overlying the deltopectoral groove.  Subcutaneous tissues were dissected out.  The cephalic vein was isolated.  Cephalic vein cutdown was made and pacemaker catheter was put into the cephalic vein, but pacemaker could not be crossed from the cephalic vein to the subclavian vein because of the kink in vein even with the help of introducer kit and J-wire we could not cross that junction so that procedure was aborted and cephalic vein was tied above and below and left in the deltopectoral groove and covered with a fat pad.  Next, a pocket was fashioned under the superficial fascia and patient was put in Trendelenburg position and a left subclavian stick was made and with the help of introducer kit and sheath the pacemaker lead was introduced from the left subclavian vein to the superior vena cava and then into the right atrium and then after multiple attempt and good position was obtained at the floor of the right ventricle where stimulation thresholds were found to be satisfactory.  So, pacemaker was selected Adapta ADSR01.  Serial #[id number].  And pacemaker was connected with the pacemaker lead.  All connection was secured properly.  Pacemaker was put in the pocket which was already created under the superficial fascia.  All bleeding points  were cauterized meticulously and pocket was dry and free of any blood.  And after putting the pacemaker in the pocket the subcutaneous tissue and skin was sutured in two separate layers and dressing was applied.  This is a Medtronic Adapta pacemaker with the pacemaker lead also manufactured by [employer].  The pacemaker lead number is [id number], 52 cm, [id number].  After finishing the procedure family was appraised of the prognosis of the patient and explained the procedure which was done.  The patient returned to the ICU in stable condition.    ____________________________ Javed Masoud, MD jm:ea D: 07/22/2012 10:22:45 ET T: 07/23/2012 04:53:55 ET JOB#: [id number]  cc: Javed Masoud, MD, <Dictator> JAVED MASOUD MD ELECTRONICALLY SIGNED 07/24/2012 18:31 

## 2014-07-05 NOTE — Discharge Summary (Signed)
PATIENT NAME:  Tiffany Austin, WESTRICH MR#:  [id number] DATE OF BIRTH:  07/04/1924  DATE OF ADMISSION:  07/21/2012 DATE OF DISCHARGE:  07/25/2012  For a detailed note, please take a look at the history and physical done on admission by Dr. Vianne Bulls.   DIAGNOSES AT DISCHARGE: An acute middle cerebral artery cerebrovascular accident. Third degree heart block with cardiogenic shock, status post pacemaker placement. Severe dementia.   DISPOSITION: The patient was discharged to the hospice home.   DISCHARGE MEDICATIONS: Morphine sulfate which is Roxanol 0.25 to 5.5 mL q.1-2 h. as needed and lorazepam 0.5 mg 1 to 2 tabs q.2-4 h. as needed.   CONSULTANTS DURING THE HOSPITAL COURSE: Dr. Lavera Guise from cardiology. Dr. Izora Gala Phifer from palliative care.   PERTINENT STUDIES DONE DURING THE HOSPITAL COURSE: Chest x-ray done on May 9 showing patient status post venous catheter placement with no evidence of pneumothorax. A CT of the head done without contrast on May 11 showing findings consistent with an area of MCA distribution infarction on the right.   HOSPITAL COURSE: This is an 81 year old female with medical problems as mentioned above who presented to the hospital on 07/21/2012 secondary to altered mental status and slurred speech. At triage and en route, the patient was noted to be significantly hypotensive with blood pressures in the 50s and heart rates in the 20s and noted to be in third degree heart block.  1.  Third degree heart block: Initially when the patient presented to the hospital with altered mental status and lethargy, it was thought that it was related to cardiogenic shock from her third degree heart block. She was urgently taken and had a temporary pacemaker wire put in. She was maintained overnight in the Intensive Care Unit, and the day after the temporary wire she went for a permanent pacemaker placement, and her hemodynamics and her heart rate and blood pressure significantly improved.  2.   Altered mental status and confusion: Initially it was thought that the patient's mental status change was related to her cardiogenic shock and her bradycardia and hypotension, although even after she had pacemaker placement, her mental status still was significantly worse where she was not waking up and not able to follow any commands. I did a CT scan of the head, which initially did not show any acute abnormality on May 10, although a repeat CT scan on May 11 did show a large MCA distribution stroke. She also had repeated arterial blood gases to see if she was retaining CO2, although that was not the case. The likely cause of her mental status change and confusion was probably related to underlying large CVA. Since the patient's mentation was not improving and she had baseline underlying dementia with poor quality of life, I obtained a palliative care consult to discuss goals of care with the family. Once palliative care met with the family to discuss goals of care, she was made a DNR and they opted to make the patient comfort care only and transfer the patient to hospice home. The patient therefore is being discharged to hospice home today.   TIME SPENT ON DISCHARGE: 40 minutes.   ____________________________ Belia Heman. Verdell Carmine, MD vjs:jm D: 07/25/2012 17:25:47 ET T: 07/25/2012 20:10:21 ET JOB#: [id number]  cc: Belia Heman. Verdell Carmine, MD, <Dictator> Henreitta Leber MD ELECTRONICALLY SIGNED 08/01/2012 20:00

## 2014-07-05 NOTE — Consult Note (Signed)
   Comments   Hospice consult complete. Plan is for transfer tomorrow AM to the Hospice Home. Orders entered. Will assess for stability in the AM prior to transfer. F2F complete.  10 minutes  Electronic Signatures: Borders, Daryl EasternJoshua R (NP)  (Signed 12-May-14 16:50)  Authored: Palliative Care   Last Updated: 12-May-14 16:50 by Malachy MoanBorders, Joshua R (NP)

## 2014-07-05 NOTE — Op Note (Signed)
PATIENT NAME:  ALDEEN, RIGA MR#:  [id number] DATE OF BIRTH:  [birth year]  DATE OF PROCEDURE:  07/21/2012  PREOPERATIVE DIAGNOSIS:  Complete heart block with heart rate of 26.   POSTOPERATIVE DIAGNOSIS:  Complete heart block with heart rate of 26.     PROCEDURE: Insertion of a temporary pacemaker in the right internal jugular line   SURGEON:  Cletis Athens, MD  DESCRIPTION OF PROCEDURE: The patient was initially seen in the Emergency Room and then transferred to the ICU and under fluoroscopic control, right internal jugular vein was prepared in a standard fashion with Hibiclens and it was draped in a sterile fashion. Right internal jugular vein was isolated in the suprasternal notch on the right side and a temporary pacemaker wire was introduced through introducer kit and an optimum position was obtained at the floor of the right ventricle where stimulation thresholds capture at 3.0 mA.  The pacemaker was set at 7.0 mA, sensitivity 3.  A postoperative chest x-ray today revealed good position of the pacer tip at the floor of the right ventricle. Family was notified.     ____________________________ Cletis Athens, MD jm:ct D: 07/21/2012 21:26:28 ET T: 07/22/2012 10:49:16 ET JOB#: [id number]  cc: Cletis Athens, MD, <Dictator> Cletis Athens MD ELECTRONICALLY SIGNED 07/24/2012 18:31
